# Patient Record
Sex: Female | Born: 1970 | Race: White | Hispanic: No | Marital: Married | Smoking: Current every day smoker
Health system: Southern US, Community
[De-identification: ages and names within clinical notes are randomized; demographics above are authoritative.]

## PROBLEM LIST (undated history)

## (undated) DIAGNOSIS — K5900 Constipation, unspecified: Secondary | ICD-10-CM

## (undated) DIAGNOSIS — F32A Depression, unspecified: Secondary | ICD-10-CM

## (undated) DIAGNOSIS — R519 Headache, unspecified: Secondary | ICD-10-CM

## (undated) DIAGNOSIS — F419 Anxiety disorder, unspecified: Secondary | ICD-10-CM

## (undated) DIAGNOSIS — J45909 Unspecified asthma, uncomplicated: Secondary | ICD-10-CM

## (undated) DIAGNOSIS — G4733 Obstructive sleep apnea (adult) (pediatric): Secondary | ICD-10-CM

## (undated) DIAGNOSIS — D649 Anemia, unspecified: Secondary | ICD-10-CM

## (undated) DIAGNOSIS — E119 Type 2 diabetes mellitus without complications: Secondary | ICD-10-CM

## (undated) DIAGNOSIS — M797 Fibromyalgia: Secondary | ICD-10-CM

## (undated) DIAGNOSIS — D259 Leiomyoma of uterus, unspecified: Secondary | ICD-10-CM

## (undated) HISTORY — PX: CHOLECYSTECTOMY: SHX55

## (undated) HISTORY — PX: LAPAROSCOPY: SHX197

---

## 1998-12-05 ENCOUNTER — Ambulatory Visit (HOSPITAL_COMMUNITY): Admission: RE | Admit: 1998-12-05 | Discharge: 1998-12-05 | Payer: Self-pay | Admitting: Gynecology

## 1998-12-22 ENCOUNTER — Inpatient Hospital Stay (HOSPITAL_COMMUNITY): Admission: EM | Admit: 1998-12-22 | Discharge: 1998-12-23 | Payer: Self-pay | Admitting: Emergency Medicine

## 1998-12-22 ENCOUNTER — Encounter: Payer: Self-pay | Admitting: Emergency Medicine

## 1999-04-08 ENCOUNTER — Encounter: Admission: RE | Admit: 1999-04-08 | Discharge: 1999-07-07 | Payer: Self-pay | Admitting: Obstetrics and Gynecology

## 1999-09-05 ENCOUNTER — Encounter: Payer: Self-pay | Admitting: Obstetrics and Gynecology

## 1999-09-05 ENCOUNTER — Ambulatory Visit (HOSPITAL_COMMUNITY): Admission: RE | Admit: 1999-09-05 | Discharge: 1999-09-05 | Payer: Self-pay | Admitting: Obstetrics and Gynecology

## 1999-09-05 ENCOUNTER — Inpatient Hospital Stay (HOSPITAL_COMMUNITY): Admission: AD | Admit: 1999-09-05 | Discharge: 1999-09-11 | Payer: Self-pay | Admitting: Obstetrics and Gynecology

## 2001-07-29 ENCOUNTER — Other Ambulatory Visit: Admission: RE | Admit: 2001-07-29 | Discharge: 2001-07-29 | Payer: Self-pay | Admitting: Obstetrics and Gynecology

## 2001-09-24 ENCOUNTER — Encounter: Admission: RE | Admit: 2001-09-24 | Discharge: 2001-12-23 | Payer: Self-pay | Admitting: Obstetrics and Gynecology

## 2002-02-04 ENCOUNTER — Encounter (INDEPENDENT_AMBULATORY_CARE_PROVIDER_SITE_OTHER): Payer: Self-pay | Admitting: Specialist

## 2002-02-04 ENCOUNTER — Inpatient Hospital Stay (HOSPITAL_COMMUNITY): Admission: AD | Admit: 2002-02-04 | Discharge: 2002-02-07 | Payer: Self-pay | Admitting: Obstetrics and Gynecology

## 2002-03-17 ENCOUNTER — Other Ambulatory Visit: Admission: RE | Admit: 2002-03-17 | Discharge: 2002-03-17 | Payer: Self-pay | Admitting: Obstetrics and Gynecology

## 2002-05-18 ENCOUNTER — Emergency Department (HOSPITAL_COMMUNITY): Admission: EM | Admit: 2002-05-18 | Discharge: 2002-05-18 | Payer: Self-pay | Admitting: Emergency Medicine

## 2003-03-30 ENCOUNTER — Other Ambulatory Visit: Admission: RE | Admit: 2003-03-30 | Discharge: 2003-03-30 | Payer: Self-pay | Admitting: Obstetrics and Gynecology

## 2003-12-22 ENCOUNTER — Encounter: Admission: RE | Admit: 2003-12-22 | Discharge: 2004-03-21 | Payer: Self-pay | Admitting: Family Medicine

## 2003-12-26 HISTORY — PX: GASTRIC BYPASS: SHX52

## 2004-02-12 ENCOUNTER — Encounter: Admission: RE | Admit: 2004-02-12 | Discharge: 2004-02-12 | Payer: Self-pay | Admitting: General Surgery

## 2004-02-13 ENCOUNTER — Ambulatory Visit (HOSPITAL_BASED_OUTPATIENT_CLINIC_OR_DEPARTMENT_OTHER): Admission: RE | Admit: 2004-02-13 | Discharge: 2004-02-13 | Payer: Self-pay | Admitting: General Surgery

## 2004-02-16 ENCOUNTER — Encounter: Admission: RE | Admit: 2004-02-16 | Discharge: 2004-03-01 | Payer: Self-pay | Admitting: General Surgery

## 2004-02-24 ENCOUNTER — Encounter: Admission: RE | Admit: 2004-02-24 | Discharge: 2004-02-24 | Payer: Self-pay | Admitting: General Surgery

## 2004-02-24 ENCOUNTER — Encounter: Payer: Self-pay | Admitting: General Surgery

## 2004-03-09 ENCOUNTER — Encounter: Payer: Self-pay | Admitting: Cardiology

## 2004-03-09 ENCOUNTER — Ambulatory Visit (HOSPITAL_COMMUNITY): Admission: RE | Admit: 2004-03-09 | Discharge: 2004-03-09 | Payer: Self-pay | Admitting: General Surgery

## 2004-03-22 ENCOUNTER — Encounter: Admission: RE | Admit: 2004-03-22 | Discharge: 2004-06-20 | Payer: Self-pay | Admitting: Family Medicine

## 2004-06-06 ENCOUNTER — Inpatient Hospital Stay (HOSPITAL_COMMUNITY): Admission: RE | Admit: 2004-06-06 | Discharge: 2004-06-09 | Payer: Self-pay | Admitting: General Surgery

## 2004-06-06 ENCOUNTER — Encounter (INDEPENDENT_AMBULATORY_CARE_PROVIDER_SITE_OTHER): Payer: Self-pay | Admitting: Specialist

## 2004-07-19 ENCOUNTER — Encounter: Admission: RE | Admit: 2004-07-19 | Discharge: 2004-10-17 | Payer: Self-pay | Admitting: Family Medicine

## 2004-11-29 ENCOUNTER — Encounter: Admission: RE | Admit: 2004-11-29 | Discharge: 2005-02-27 | Payer: Self-pay | Admitting: Family Medicine

## 2005-01-11 ENCOUNTER — Ambulatory Visit: Payer: Self-pay | Admitting: Pulmonary Disease

## 2005-02-09 ENCOUNTER — Ambulatory Visit (HOSPITAL_COMMUNITY): Admission: RE | Admit: 2005-02-09 | Discharge: 2005-02-09 | Payer: Self-pay | Admitting: Family Medicine

## 2005-05-26 ENCOUNTER — Other Ambulatory Visit: Admission: RE | Admit: 2005-05-26 | Discharge: 2005-05-26 | Payer: Self-pay | Admitting: Obstetrics and Gynecology

## 2008-02-28 ENCOUNTER — Encounter (INDEPENDENT_AMBULATORY_CARE_PROVIDER_SITE_OTHER): Payer: Self-pay | Admitting: Obstetrics and Gynecology

## 2008-02-28 ENCOUNTER — Ambulatory Visit (HOSPITAL_COMMUNITY): Admission: RE | Admit: 2008-02-28 | Discharge: 2008-02-28 | Payer: Self-pay | Admitting: Obstetrics and Gynecology

## 2011-05-09 NOTE — Op Note (Signed)
Mackenzie Castillo, Mackenzie Castillo             ACCOUNT NO.:  [id number]   MEDICAL RECORD NO.:  [id number]          PATIENT TYPE:  AMB   LOCATION:  SDC                           FACILITY:  [facility]   PHYSICIAN:  Guy Sandifer. Henderson Cloud, M.D. DATE OF BIRTH:  [birth year]   DATE OF PROCEDURE:  02/28/2008  DATE OF DISCHARGE:                               OPERATIVE REPORT   PREOPERATIVE DIAGNOSIS:  Right lower quadrant pain.   POSTOPERATIVE DIAGNOSIS:  Right ovarian cyst.   PROCEDURE:  Opened laparoscopy with right salpingo-oophorectomy.   SURGEON:  Guy Sandifer. Henderson Cloud, M.D.   ANESTHESIA:  General with endotracheal intubation.   SPECIMENS:  Right ovary and tube to pathology.   ESTIMATED BLOOD LOSS:  Minimal.   INDICATIONS AND CONSENT:  This patient is a 40 year old married white  female G2, P2, status post tubal ligation with right lower quadrant  pain.  Details are dictated in the history and physical.  Laparoscopy,  probable right salpingo-oophorectomy is discussed with the patient  preoperatively.  Potential risks and complications have been discussed  preoperatively including, but limited to infection, organ damage,  bleeding requiring transfusion of blood products with possible HIV and  hepatitis acquisition, DVT, PE, pneumonia, recurrent pain, laparotomy.  All questions were answered and consent is signed on the chart.   FINDINGS:  Upper abdomen is grossly normal.  Uterus has a veil of  adipose tissue along the anterior fundus.  The left ovary is normal.  The right ovary contains approximately 4 cm smooth cyst.  There are no  adhesions.   PROCEDURE:  The patient is taken to the operating room where she is  identified, placed in dorsal supine position and general anesthesia is  induced via endotracheal intubation.  She is then prepped abdominally  and vaginally.  Bladder is straight catheterized.  Hulka tenaculum is  placed in the uterus as manipulator and she is draped in a sterile  fashion.  The  infraumbilical and suprapubic areas are injected in the  midline with 0.50% plain Marcaine.  An infraumbilical incision is made.  Using towel clips on either side of the umbilicus to elevate the  anterior abdominal wall, an attempt is made at placing a disposable  Veress needle.  However, this results retroperitoneal placement of the  needle tip.  Therefore, it is converted to open laparoscopy.  The  infraumbilical incision is extended slightly.  Under direct  visualization, dissection is carried out in layers to the fascia.  The  fascia is grasped and incised.  Angles are anchored with zero Monocryl  sutures at each angle.  Blunt dissection is used to enter the peritoneal  cavity without difficulty.  A disposable open laparoscopic trocar sleeve  is then placed and anchored down.  Pneumoperitoneum is induced.  A small  incision is made in the midline of the abdomen at a point that is  suprapubic.  A 5 mm disposable XL bladeless trocar sleeve is then placed  under direct visualization.  The above findings are noted.  The course  the right ureter seen to be clear surgery.  Then using the gyrus bipolar  cautery cutting instrument, the right infundibulopelvic ligament is  taken down, working across the mesosalpinx to the utero-ovarian which is  also taken down.  Good hemostasis is maintained.  The tube and ovary are  placed in the anterior cul-de-sac.  Then using the 5 mm laparoscopic  through the suprapubic trocar sleeve, the EndoCatch is placed through  the umbilical trocar sleeve.  The ovary is then easily retrieved through  the umbilical incision.  Switching back to the operative laparoscopic,  inspection reveals excellent hemostasis all around.  Suprapubic trocar  sleeve is removed and hemostasis is again noted.  The umbilical trocar  sleeve is removed, thereby reducing the pneumoperitoneum.  The angle  sutures are tied in the midline.  Then under good visualization, the  same suture is  used to close the anterior fascia.  The skin incisions  are closed with interrupted 3-0 Vicryl sutures.  Dermabond is applied.  Hulka tenaculum is removed and good hemostasis is noted.  All counts  correct.  The patient is awakened, taken to recovery room in stable  condition.      Guy Sandifer Henderson Cloud, M.D.  Electronically Signed     JET/MEDQ  D:  02/28/2008  T:  03/01/2008  Job:  [id number]

## 2011-05-09 NOTE — H&P (Signed)
Mackenzie Castillo, HOGLUND             ACCOUNT NO.:  [id number]   MEDICAL RECORD NO.:  [id number]          PATIENT TYPE:  AMB   LOCATION:  SDC                           FACILITY:  [facility]   PHYSICIAN:  Guy Sandifer. Henderson Cloud, M.D. DATE OF BIRTH:  Jul 12, 1971   DATE OF ADMISSION:  02/28/2008  DATE OF DISCHARGE:                              HISTORY & PHYSICAL   CHIEF COMPLAINT:  Right lower quadrant pain.   HISTORY OF PRESENT ILLNESS:  This patient is a 40 year old married white  female, G2, P2, status post tubal ligation, who has right lower quadrant  and central pelvic pain most days.  This has bothered her on and off for  at least 2 or 3 years but is getting worse with each menses.  It  occasionally takes her off her feet.  The patient reports a negative  evaluation for a hernia, as well as a normal colonoscopy.  Ultrasound in  my office on January 30, 2008 reveals a uterus measuring 10.9 x 4.9 x  5.6 cm.  The right ovary has a 3.4 cm septated hemorrhagic cyst,  possibly an endometrioma.  The left ovary appeared normal.  There was no  free fluid.  She is being admitted for laparoscopy with possible right  salpingo-oophorectomy.  Potential risks and complications have been  discussed preoperatively.   PAST MEDICAL HISTORY:  Negative.   PAST SURGICAL HISTORY:  1. Laparoscopic tubal ligation.  2. Laparoscopic gastric bypass.  3. Cholecystectomy.  4. Laparoscopy in 1998.   OBSTETRICAL HISTORY:  Cesarean section x2.   MEDICATIONS:  Multivitamins.   ALLERGIES:  CODEINE leading to itching.   SOCIAL HISTORY:  Denies tobacco, alcohol or drug abuse.   FAMILY HISTORY:  Positive for diabetes, heart disease, thyroid disease  and headaches.   REVIEW OF SYSTEMS:  NEUROLOGIC:  Denies headache.  CARDIAC:  Denies  chest pain.  PULMONARY:  Denies shortness of breath.  GI:  Denies recent  changes in bowel habits.   PHYSICAL EXAMINATION:  VITAL SIGNS:  Height 5 feet 4 inches.  Weight 284  pounds, blood  pressure 122/78.  HEENT:  Without thyromegaly.  LUNGS:  Clear to auscultation.  HEART:  Regular rate and rhythm.  BACK:  Without CVA tenderness.  BREASTS:  Without mass, retraction or discharge.  ABDOMEN:  Obese, soft, with mild bilateral lower quadrant tenderness  with no rebound or masses.  PELVIC:  Vulva, vagina and cervix without lesion.  Exam highly  compromised by patient habitus.  EXTREMITIES:  Grossly within normal limits.  NEUROLOGIC:  Grossly within normal limits.   ASSESSMENT:  Right lower quadrant pain.   PLAN:  Laparoscopy, possible right salpingo-oophorectomy.      Guy Sandifer Henderson Cloud, M.D.  Electronically Signed     JET/MEDQ  D:  02/18/2008  T:  02/19/2008  Job:  [id number]

## 2011-05-12 NOTE — Op Note (Signed)
[facility]  Patient:    Mackenzie Castillo, Mackenzie Castillo Visit Number: [id number] MRN: [id number]          Service Type: OBS Location: 910A 9115 [id number] Attending Physician:  Soledad Gerlach Dictated by:   Guy Sandifer Arleta Creek, M.D. Proc. Date: 02/04/02 Admit Date:  02/04/2002                             Operative Report  PREOPERATIVE DIAGNOSES:       1. Intrauterine pregnancy at 57 weeks estimated                                  gestational age.                               2. Gestational diabetes on insulin.                               3. Asymmetric intrauterine growth retardation.  POSTOPERATIVE DIAGNOSES:      1. Intrauterine pregnancy at 47 weeks estimated                                  gestational age.                               2. Gestational diabetes on insulin.                               3. Asymmetric intrauterine growth retardation.  PROCEDURE:                    Repeat low transverse cesarean section with bilateral tubal ligation.  SURGEON:                      Guy Sandifer. Arleta Creek, M.D.  ANESTHESIA:                   Spinal by Cline Crock, M.D.  ESTIMATED BLOOD LOSS:         800 cc.  FINDINGS:                     Viable female infant.  Apgars of 8 and 9 at one and five minutes respectively.  Birth weight is 4 pounds 13 ounces.  Arterial cord pH is pending.  INDICATIONS AND CONSENT:      This patient is a 40 year old married white female G2, P1 with an EDC of March 03, 2002.  Prenatal care has been complicated by gestational diabetes on insulin.  The patient presented for amniocentesis for fetal lung maturity yesterday.  On ultrasound the abdominal circumference had dropped off dramatically.  The estimated fetal weight had also dropped dramatically as well.  The ratios were asymmetric.  It was felt this was consistent with evolving asymmetric IUGR.  In view of this delivery was recommended.  Amniocentesis was not done.  The  patient also requests tubal ligation.  Potential risks of the surgery have been discussed including, but not limited to, infection, bleeding requiring transfusion of  blood products with possible HIV and hepatitis acquisition, DVT, PE, pneumonia.  Permanence of tubal ligation as well as failure risks and increased ectopic risks have been discussed.  All questions are answered.  PROCEDURE:                    Patient is taken to the operating room where a spinal anesthetic is placed.  She is then placed in the dorsal supine position with a 15 degree left lateral wedge.  She is prepped abdominally.  Foley catheter is placed and the bladder is drained.  She is draped in a sterile fashion.  After testing for adequate spinal anesthesia skin is entered through the previous Pfannenstiel scar.  Dissection is carried out in layers to the peritoneum.  The peritoneum is incised and extended superiorly and inferiorly. Vesicouterine peritoneum is taken down cephalolaterally.  The bladder flap is developed and the bladder blade is placed.  Uterus is then incised in a low transverse manner.  The uterine cavity is entered bluntly with a hemostat. The uterine incision is then extended cephalolaterally with the fingers. Artificial rupture of membranes is then carried out for clear fluid.  The vertex is then delivered and the oro and nasopharynx are suctioned.  Nuchal cord x3 is noted.  Remainder of the infant is delivered.  Good cry and tone is noted.  The cord is clamped and cut and the infant is handed to the waiting pediatrics team.  Placenta is manually delivered and sent to pathology. Intrauterine contour is normal.  The uterus is closed in a running locking layer of 0 Monocryl suture which achieves good hemostasis.  The left ovary is noted to have an adhesion to the omentum.  This is taken down with the Bovie without difficulty.  The ovary otherwise looks normal.  The left fallopian tube is identified  from cornu from fimbria, grasped in its mid ampullary portion with the Babcock clamp.  An intervening knuckle of tube is then doubly ligated with two free ties of 0 plain suture.  The intervening knuckle of tissue is then sharply resected.  Monopolar cautery is used to obtain complete hemostasis.  Similar procedure is carried out on the right side.  The right ovary appears normal.  Anterior peritoneum is then closed in a running fashion with 0 Monocryl suture which is also used to reapproximate the pyramidalis muscle in the midline.  Anterior rectus fascia is closed in a running fashion with 0 PDS suture and the skin is closed with clips.  All sponge, instrument, and needle counts are correct and the patient is transferred to the recovery room in stable condition. Dictated by:   Guy Sandifer Arleta Creek, M.D. Attending Physician:  Soledad Gerlach DD:  02/04/02 TD:  02/04/02 Job: [id number]

## 2011-05-12 NOTE — Op Note (Signed)
Mackenzie Castillo, Mackenzie Castillo                       ACCOUNT NO.:  [id number]   MEDICAL RECORD NO.:  [id number]                   PATIENT TYPE:  INP   LOCATION:  0001                                 [facility]ACILITY:  [facility]   PHYSICIAN:  Sharlet Salina T. Hoxworth, M.D.          DATE OF BIRTH:  1971-02-20   DATE OF PROCEDURE:  06/06/2004  DATE OF DISCHARGE:                                 OPERATIVE REPORT   DISCHARGE DIAGNOSES:  1. Morbid obesity.  2. Cholelithiasis.   SURGICAL PROCEDURES:  1. Laparoscopic Roux-Y gastric bypass.  2. Laparoscopic cholecystectomy with intraoperative cholangiogram.   SURGEON:  Dr. Johna Sheriff   ASSISTANT:  Dr. Danna Hefty   ANESTHESIA:  General.   BRIEF HISTORY:  Mackenzie Castillo is a 40 year old white female with a  lifelong history of progressive morbid obesity unresponsive to multiple  medical attempts at weight loss.  She has multiple comorbidities including  diabetes mellitus, asthma, sleep apnea, and urinary incontinence.  After  extensive discussion of options and benefits and risks of surgery detailed  elsewhere, we have elected to proceed with laparoscopic Roux-Y gastric  bypass.  Preoperative work-up has also revealed cholelithiasis, and we have  recommended proceeding with cholecystectomy with cholangiogram at the same  time.  Specific risks of bile leak, bile duct injury were discussed and  understood.  She is now brought to the operating room for these procedures.   DESCRIPTION OF OPERATION:  The patient was brought to the operating room,  placed in supine position on the operating table, and general endotracheal  anesthesia was induced.  She had received preoperative antibiotics.  Lovenox  40 mg had been given preoperatively.  PAS were in place.  The abdomen was  widely sterilely prepped and draped.  Trocar sites were anesthetized with  local anesthesia before incisions.  A 1 cm incision was made in the left  costal margin, and abdominal access  was obtained with an OptiView trocar  without difficulty and pneumoperitoneum established.  Under direct vision,  11 mm trocars were placed in the right mid abdomen, right upper quadrant  through the falciform ligament and left mid abdomen and a 5 mm trocar in the  left flank.  There were some omental adhesions in the pelvis from previous C-  section, and these were mobilized with harmonic scalpel, completely freeing  the omentum.  The omentum and transverse colon was elevated and ligament of  Treitz identified.  A 40 cm afferent limb was measured, and small bowel at  this point appeared quite mobile up toward the edge of the liver.  The small  bowel was then divided with a single firing of the EndoGIA vascular load  stapler.  The mesentery was further divided with the harmonic scalpel down  to the first arcade which appeared to provide plenty of mobility.  Both ends  of the bowel appeared pink and healthy with intact staple lines.  The end of  the efferent limb  was marked by suturing a Penrose drain to it.  A 100 cm  efferent limb was then measured.  At this point, a jejunojejunostomy was  created with enterotomies with the harmonic scalpel and a single firing of  the EndoGIA vascular load stapler.  The staple line appeared intact without  bleeding.  The common enterotomy was closed with running 2-0 Vicryl, begun  at either end of the enterotomy and tied centrally.  The mesenteric defect  behind the afferent limb was then exposed then closed with running 2-0 silk,  extending this out onto the jejunojejunostomy.  Tisseal tissue sealant was  then used over the staple and suture lines.  Following this, the patient was  placed in steep reverse Trendelenburg position, and a Nathanson retractor  was placed through a 5 mm incision in the epigastrium and the left lobe of  the liver elevated, nicely exposing the hiatus and stomach.  Angle of His  was then mobilized dividing peritoneum over the left  crus, and dissection  was carried down along the left crus toward the lesser sac bluntly.  A point  along the lesser curve was then chosen for creation of the pouch about 5 cm  from the EG junction just beyond the second vessel.  Peritoneum along the  lesser curve was incised with the harmonic scalpel, and dissection was  carried at a right angle to the stomach toward the lesser sac.  The  dissection progressed nicely, although we really were not able to ever enter  a free lesser sac.  However, the posterior stomach was nicely mobilized with  basically areolar-type of attachments and initial firing of the EndoGIA blue-  load stapler was performed at the right angles to the lesser curve.  Four  further firings were then used to create a tubular pouch up toward the angle  of His, at which point the stomach was seen to be completely divided.  Hemostasis was assured.  The efferent was brought up to the pouch and  reached without tension in antecolic fashion.  An initial posterior row of  running 2-0 Vicryl was placed between the efferent limb and the gastric  pouch.  Enterotomies were then made with the harmonic scalpel, and an  anastomosis was made with the 45 mm linear stapler blue load.  The staple  line was inspected and was intact without bleeding.  The common enterotomy  was then closed with running 2-0 Vicryl, begun at either end and tied  centrally.  Following this, the Ewald tube was passed through the  anastomosis and an outer anterior row of running 2-0 Vicryl was placed.  At  this point, the patient was endoscoped from above and with the pouch tightly  distended, the efferent limb clamped, there was no evidence of leak of the  pouch under saline irrigation.  The saline was then evacuated.  A closed  suction drain was brought out through the lateral trocar site and left  subhepatic space.  Tisseal tissue sealant was used to cover the staple and suture lines.  At this point, attention  was turned to the gallbladder.  The  Nathanson retractor was removed.  The right epigastric port was redirected  toward the right upper quadrant.  Two additional 5 mm trocars were placed in  the right flank under direct vision.  The gallbladder was visualized and the  fundus elevated.  The liver was quite large and heavy, and exposure was  somewhat difficult, although visualization was adequate of the  distal  gallbladder and Calot's triangle.  Fibrofatty tissue was stripped off the  neck of the gallbladder toward the porta hepatis.  The distal gallbladder  was dissected thoroughly and with somewhat tedious dissection, the cystic  artery and cystic duct were clearly identified, the cystic duct dissected  over about 1 cm, the cystic duct gallbladder junction clearly defined.  At  this point, the cystic duct was clipped at the junction with the  gallbladder, and the cystic artery was clipped.  Operative cholangiogram was  then obtained through the cystic duct which showed normal common bile duct  and intrahepatic ducts with free flow into the duodenum and no filling  defects.  Following this, the Cholangiocath was removed, and the cystic duct  was doubly clipped proximally and divided.  The cystic artery was doubly  clipped proximally and divided.  The gallbladder was dissected free from its  bed using hook cautery and harmonic scalpel.  Again, dissection was quite  tedious, and exposure was difficult, but I progressed satisfactorily.  There  was one area of venous bleeding in the gallbladder bed near the infundibulum  and was a little difficult to control but was completely stopped with  cautery.  The Surgicel pack was then placed additionally, but it stayed dry  during the remainder of the dissection.  The gallbladder was then completely  dissected free from its attachments, placed in EndoCatch bag and brought out  through one of the 11 mm trocar sites evacuating the stones, and the   gallbladder came out without enlarging the site.  The operative site was  inspected for hemostasis which appeared complete.  Trocars were then all  removed.  Skin incisions were closed with staples.  Sponge, needle, and  instrument counts were correct.  Dry sterile dressings were applied and the  patient taken to recovery in good condition.                                               Lorne Skeens. Hoxworth, M.D.    Tory Emerald  D:  06/06/2004  T:  06/06/2004  Job:  [id number]

## 2011-05-12 NOTE — Discharge Summary (Signed)
[facility]  Patient:    Mackenzie Castillo, Mackenzie Castillo Visit Number: [id number] MRN: [id number]          Service Type: OBS Location: 910A 9115 01 Attending Physician:  Cordelia Pen Ii Dictated by:   Danie Chandler, R.N. Admit Date:  02/04/2002 Discharge Date: 02/07/2002                             Discharge Summary  ADMITTING DIAGNOSES:          1. Intrauterine pregnancy at 36-weeks estimated                                  gestational age.                               2. Gestational diabetes on insulin.                               3. Asymmetric intrauterine growth retardation.  DISCHARGE DIAGNOSES:          1. Intrauterine pregnancy at 36-weeks estimated                                  gestational age.                               2. Gestational diabetes on insulin.                               3. Asymmetric intrauterine growth retardation.  PROCEDURES:                   On February 04, 2002, repeat low transverse cesarean section with bilateral tubal ligation.  REASON FOR ADMISSION:         The patient is a 40 year old married white female, gravida 2, para 1, with estimated date of confinement of March 03, 2002.  Her prenatal care has been complicated by gestational diabetes on insulin.  The patient presented for amniocentesis for fetal lung maturity on February 03, 2002.  On ultrasound, the abdominal circumference had dropped off dramatically.  The estimated fetal weight had also dropped dramatically as well.  The ratios were asymmetric. It was felt this was consistent with evolving asymmetric IUG.  In view of this, delivery was recommended.  Amniocentesis was not done.  The patient also requested tubal ligation.  HOSPITAL COURSE:              The patient was taken to the operating room and underwent the above-named procedure without complications.  This was productive of a viable female infant with Apgars of 8 at one minute and 9  at five minutes.  Postoperatively, on day one, the patient had a good return of bowel function.  Her hemoglobin was 9.9, hematocrit 29.0, and white blood cell count 6.3.  Postoperative day two, she was tolerating regular diet and ambulating well without difficulty.  She also had good pain control.  Blood sugars were stable.  The patient was discharged home on postoperative day three.  CONDITION ON DISCHARGE:  Good.  DIET:                         Regular as tolerated.  ACTIVITY:                     No heavy lifting, no driving or vaginal entry.  FOLLOW-UP:                    She is to follow up in the office in one to two weeks for incision check and she is to call for temperature greater than 100 degrees, persistent nausea or vomiting, heavy vaginal bleeding, and/or redness or drainage from the incision site.  She is to follow up with Dr. Chestine Spore in reference to gestational diabetes mellitus.  DISCHARGE MEDICATIONS:        1. Prenatal vitamin 1 p.o. q.d.                               2. Tylox #30 as directed by doctor. Dictated by:   Danie Chandler, R.N. Attending Physician:  Soledad Gerlach DD:  02/07/02 TD:  02/07/02 Job: [id number]

## 2011-05-12 NOTE — Op Note (Signed)
Mackenzie Castillo, Mackenzie Castillo                       ACCOUNT NO.:  [id number]   MEDICAL RECORD NO.:  [id number]                   PATIENT TYPE:  INP   LOCATION:  0001                                 FACILITY:  [facility]   PHYSICIAN:  Vikki Ports, M.D.         DATE OF BIRTH:  05-Nov-1971   DATE OF PROCEDURE:  06/06/2004  DATE OF DISCHARGE:                                 OPERATIVE REPORT   PROCEDURE:  Upper endoscopy.   SURGEON:  Danna Hefty, M.D.   DESCRIPTION OF PROCEDURE:  At the completion of laparoscopic Roux-en-Y  gastric bypass by Dr. Jaclynn Guarneri, I inserted the Olympus endoscope through  the oral pharynx and down through the esophagus.  The pouch was easily  visualized as was the anastomosis.  It was patent.  There was no evidence of  leak.  The pouch measured 6 cm in length.  It was aspirated on pulling out  the scope.  The remainder of the dictation will be dictated by Dr. Johna Sheriff.                                               Vikki Ports, M.D.    KRH/MEDQ  D:  06/06/2004  T:  06/06/2004  Job:  [id number]

## 2011-05-12 NOTE — Discharge Summary (Signed)
Mackenzie Castillo, TUCCI                       ACCOUNT NO.:  [id number]   MEDICAL RECORD NO.:  [id number]                   PATIENT TYPE:  INP   LOCATION:  0483                                 FACILITY:  [facility]   PHYSICIAN:  Sharlet Salina T. Hoxworth, M.D.          DATE OF BIRTH:  03/31/1971   DATE OF ADMISSION:  06/06/2004  DATE OF DISCHARGE:  06/09/2004                                 DISCHARGE SUMMARY   DISCHARGE DIAGNOSES:  1. Morbid obesity.  2. Cholelithiasis.   OPERATION/PROCEDURE:  1. Laparoscopic Roux-Y gastric bypass June 13th.  2. Laparoscopic cholecystectomy June 13th.   HISTORY OF PRESENT ILLNESS:  Ms. Mackenzie Castillo is a 40 year old white female a  patient of Dr. Leonides Sake, with an essentially lifelong history of  progressive obesity unresponsive to medical management.  She has developed  significant comorbidities including diabetes, asthma, and chronic joint  pain.  After an extensive evaluation and preoperative workup, the patient is  admitted for a laparoscopic Roux-Y gastric bypass.  She is also found to  have gallstones and a cholecystectomy will be performed as well.   PAST MEDICAL HISTORY:  Surgeries significant for diagnostic laparoscopy in  1999, C-section x2 in 2000 and 2003, and a tubal ligation in 2003.  She is  followed for ADOM, asthma, and seasonal allergies.   MEDICATIONS:  1. Singulair 10 mg daily.  2. Allegra one daily.  3. Advair Diskus twice daily.  4. Albuterol 2 puffs p.r.n.  5. Glucophage   There are no known allergies.   Social History, Family History, and Review of Systems see dictated H&P.   PHYSICAL EXAMINATION:  VITAL SIGNS:  She is 5 feet 5 inches, 334 pounds with  a BMI of 55.7.  The remainder of the physical examination is unremarkable.   HOSPITAL COURSE:  The patient was admitted on the morning of her procedure  and underwent an unremarkable laparoscopic Roux-Y gastric bypass and  cholecystectomy.  Her postoperative course was  smooth.  She was afebrile on  the first postoperative day and Gastrografin swallow was negative.  She was  begun on liquids which we were able to advance to full liquids without  difficulty.  Follow up hemoglobin was 10.4 and white count normal.  She was  discharged home on June 09, 2004.   DISCHARGE MEDICATIONS:  The same as admission plus Roxicet elixir for pain.   FOLLOW UP:  In my office in 1 week.                                               Lorne Skeens. Hoxworth, M.D.    Tory Emerald  D:  06/29/2004  T:  06/29/2004  Job:  [id number]   cc:   Holley Bouche, M.D.  [address]  [place], [place] [address]  Fax: [phone]

## 2011-09-15 LAB — CBC
Hemoglobin: 10.8 — ABNORMAL LOW
MCV: 63.7 — ABNORMAL LOW
Platelets: 328
RDW: 22.9 — ABNORMAL HIGH

## 2012-03-18 ENCOUNTER — Other Ambulatory Visit: Payer: Self-pay | Admitting: Family Medicine

## 2012-03-18 DIAGNOSIS — M545 Low back pain: Secondary | ICD-10-CM

## 2012-03-19 ENCOUNTER — Ambulatory Visit
Admission: RE | Admit: 2012-03-19 | Discharge: 2012-03-19 | Disposition: A | Discharge status: Home / Self Care | Payer: No Typology Code available for payment source | Source: Ambulatory Visit | Attending: Family Medicine | Admitting: Family Medicine

## 2012-03-19 DIAGNOSIS — M545 Low back pain: Secondary | ICD-10-CM

## 2013-11-03 ENCOUNTER — Other Ambulatory Visit: Payer: Self-pay | Admitting: Obstetrics and Gynecology

## 2013-11-03 DIAGNOSIS — R928 Other abnormal and inconclusive findings on diagnostic imaging of breast: Secondary | ICD-10-CM

## 2013-11-25 ENCOUNTER — Ambulatory Visit
Admission: RE | Admit: 2013-11-25 | Discharge: 2013-11-25 | Disposition: A | Discharge status: Home / Self Care | Payer: BC Managed Care – PPO | Source: Ambulatory Visit | Attending: Obstetrics and Gynecology | Admitting: Obstetrics and Gynecology

## 2013-11-25 DIAGNOSIS — R928 Other abnormal and inconclusive findings on diagnostic imaging of breast: Secondary | ICD-10-CM

## 2014-11-24 ENCOUNTER — Other Ambulatory Visit: Payer: Self-pay | Admitting: Obstetrics and Gynecology

## 2014-11-25 LAB — CYTOLOGY - PAP

## 2015-11-08 ENCOUNTER — Emergency Department (HOSPITAL_COMMUNITY): Payer: Self-pay

## 2015-11-08 ENCOUNTER — Encounter (HOSPITAL_COMMUNITY): Payer: Self-pay

## 2015-11-08 ENCOUNTER — Emergency Department (HOSPITAL_COMMUNITY)
Admission: EM | Admit: 2015-11-08 | Discharge: 2015-11-08 | Disposition: A | Discharge status: Home / Self Care | Payer: Self-pay | Attending: Emergency Medicine | Admitting: Emergency Medicine

## 2015-11-08 DIAGNOSIS — F17210 Nicotine dependence, cigarettes, uncomplicated: Secondary | ICD-10-CM | POA: Insufficient documentation

## 2015-11-08 DIAGNOSIS — M5442 Lumbago with sciatica, left side: Secondary | ICD-10-CM

## 2015-11-08 DIAGNOSIS — E119 Type 2 diabetes mellitus without complications: Secondary | ICD-10-CM | POA: Insufficient documentation

## 2015-11-08 DIAGNOSIS — M5440 Lumbago with sciatica, unspecified side: Secondary | ICD-10-CM | POA: Insufficient documentation

## 2015-11-08 DIAGNOSIS — Z79899 Other long term (current) drug therapy: Secondary | ICD-10-CM | POA: Insufficient documentation

## 2015-11-08 HISTORY — DX: Type 2 diabetes mellitus without complications: E11.9

## 2015-11-08 MED ORDER — OXYCODONE-ACETAMINOPHEN 5-325 MG PO TABS: 1.0000 | ORAL_TABLET | Freq: Four times a day (QID) | ORAL | Status: DC | PRN

## 2015-11-08 MED ORDER — CYCLOBENZAPRINE HCL 10 MG PO TABS
5.0000 mg | ORAL_TABLET | Freq: Once | ORAL | Status: AC
  Administered 2015-11-08: 5 mg via ORAL
  Filled 2015-11-08: qty 1

## 2015-11-08 MED ORDER — OXYCODONE-ACETAMINOPHEN 5-325 MG PO TABS
1.0000 | ORAL_TABLET | Freq: Once | ORAL | Status: AC
  Administered 2015-11-08: 1 via ORAL
  Filled 2015-11-08: qty 1

## 2015-11-08 MED ORDER — PREDNISONE 10 MG (21) PO TBPK: 10.0000 mg | ORAL_TABLET | Freq: Every day | ORAL | Status: DC

## 2015-11-08 MED ORDER — DEXAMETHASONE SODIUM PHOSPHATE 4 MG/ML IJ SOLN
10.0000 mg | Freq: Once | INTRAMUSCULAR | Status: AC
  Administered 2015-11-08: 10 mg via INTRAMUSCULAR
  Filled 2015-11-08: qty 3

## 2015-11-08 NOTE — Discharge Instructions (Signed)
Ms. Frevert,  Nice meeting you! Your xray showed degenerative changes in your spine. Please read the following information regarding back pain. Please follow-up with your primary care provider within one week. After your pain medication has run out, you may take 800 mg ibuprofen (four 200 mg tablets) three times daily with meals for 2 weeks. Return to the emergency department if you develop fevers, chills, increased pain/numbness, inability to walk, loss of bowel or bladder control. I hope you feel better soon!  S. Wendie Simmer, PA-C

## 2015-11-08 NOTE — ED Provider Notes (Signed)
CSN: [id number]     Arrival date & time 11/08/15  [id number] History   First MD Initiated Contact with Patient 11/08/15 1010     Chief Complaint  Patient presents with  . Back Pain   HPI   Mackenzie Castillo is a 44 y.o. F PMH significant for DMII presenting with a one month history of back pain that has become progressively worse over the last week. She presented to her PCP for similar back pain, and was prescribed tramadol, which provided moderate relief. She describes her pain as lumbar in location, radiating down her left leg, sharp, constant, worse with movement. 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 She denies fever, chills, CP, SOB, abdominal pain, change in bowel/bladder habits or function, inability to ambulate.   Past Medical History  Diagnosis Date  . Diabetes mellitus without complication [facility])    Past Surgical History  Procedure Laterality Date  . Gastric bypass  2005   History reviewed. No pertinent family history. Social History  Substance Use Topics  . Smoking status: Heavy Tobacco Smoker -- 1.00 packs/day    Types: Cigarettes  . Smokeless tobacco: None  . Alcohol Use: Yes     Comment: socially   OB History    No data available     Review of Systems  Ten systems are reviewed and are negative for acute change except as noted in the HPI   Allergies  Review of patient's allergies indicates no known allergies.  Home Medications   Prior to Admission medications   Medication Sig Start Date End Date Taking? Authorizing Provider  Capsaicin 0.025 % PADS Apply 1 patch topically daily as needed (back pain).   Yes Historical Provider, MD  tizanidine (ZANAFLEX) 2 MG capsule Take 2-4 mg by mouth at bedtime.   Yes Historical Provider, MD  traMADol (ULTRAM) 50 MG tablet Take 50-100 mg by mouth every 6 (six) hours as needed for moderate pain.   Yes Historical Provider, MD   oxyCODONE-acetaminophen (PERCOCET/ROXICET) 5-325 MG tablet Take 1-2 tablets by mouth every 6 (six) hours as needed for severe pain. 11/08/15   [place] Lions, PA-C  predniSONE (STERAPRED UNI-PAK 21 TAB) 10 MG (21) TBPK tablet Take 1 tablet (10 mg total) by mouth daily. Take 6 tabs by mouth daily  for 2 days, then 5 tabs for 2 days, then 4 tabs for 2 days, then 3 tabs for 2 days, 2 tabs for 2 days, then 1 tab by mouth daily for 2 days 11/08/15   [place] Lions, PA-C   BP 120/80 mmHg  Pulse 80  Temp(Src) 99.1 F (37.3 C) (Oral)  Resp 18  Ht 5\' 4"  (1.626 m)  Wt 265 lb (120.203 kg)  BMI 45.46 kg/m2  SpO2 99%  LMP 09/08/2015 Physical Exam  Constitutional: She is oriented to person, place, and time. She appears well-developed and well-nourished. No distress.  HENT:  Head: Normocephalic and atraumatic.  Mouth/Throat: Oropharynx is clear and moist. No oropharyngeal exudate.  Eyes: Conjunctivae and EOM are normal. Pupils are equal, round, and reactive to light. Right eye exhibits no discharge. Left eye exhibits no discharge. No scleral icterus.  Neck: Normal range of motion. Neck supple.  Cardiovascular: Normal rate, regular rhythm, normal heart sounds and intact distal pulses.  Exam reveals no gallop and no friction rub.   No murmur heard. Pulmonary/Chest: Effort normal and breath sounds normal. No respiratory distress. She  has no wheezes. She has no rales. She exhibits no tenderness.  Abdominal: Soft. Bowel sounds are normal. She exhibits no distension and no mass. There is no tenderness. There is no rebound and no guarding.  Musculoskeletal: Normal range of motion. She exhibits tenderness. She exhibits no edema.  Midline and paraspinal tenderness at L3-L5. Straight leg raise positive.   Lymphadenopathy:    She has no cervical adenopathy.  Neurological: She is alert and oriented to person, place, and time. She displays normal reflexes. No cranial nerve deficit. She exhibits  normal muscle tone. Coordination normal.  Skin: Skin is warm and dry. No rash noted. She is not diaphoretic. No erythema.  Psychiatric: She has a normal mood and affect. Her behavior is normal.  Nursing note and vitals reviewed.   ED Course  Procedures   Imaging Review Dg Lumbar Spine Complete  11/08/2015  CLINICAL DATA:  Chronic low back pain for several weeks, no known injury, initial encounter EXAM: LUMBAR SPINE - COMPLETE 4+ VIEW COMPARISON:  None. FINDINGS: Five lumbar type vertebral bodies are well visualized. Vertebral body height is well maintained. No pars defects or spondylolisthesis is seen. Mild osteophytic changes are noted. IMPRESSION: Degenerative change without acute abnormality. Electronically Signed   By: Inez Catalina M.D.   On: 11/08/2015 12:08   I have personally reviewed and evaluated these images and lab results as part of my medical decision-making.  MDM   Final diagnoses:  Left-sided low back pain with sciatica, sciatica laterality unspecified   Patient non-toxic appearing. VSS. Extraspinal disorders (ruptured AAA, dissection, meningitis, MI, GI disorders, pelvic disorders, pyelonephritis, pleuritis, pneumonia) less likely based on patient interview, past medical history and exam. Although patient's pain has not persisted for 6 weeks or more, will obtain lumbar films since pain has increased over the last week.   Lumbar xray showed degenerative change but negative for acute process. Discussed results with patient. Upon reevaluation, patient is feeling better after percocet. Will give percocet for home for a few days, and encouraged 800 mg ibuprofen three times daily with meals. Patient may be safely discharged home. Discussed reasons for return. Patient to follow-up with primary care provider within one week. Patient in understanding and agreement with the plan.   [place] Lions, PA-C 11/10/15 [place], MD 11/13/15 [phone]

## 2015-11-08 NOTE — ED Notes (Signed)
Pt reports left lower back pain that has been present x1 month.  Pt reports the pain had eased off for 1 week but returned for the past two.  Pt denies any urinary symptoms or injury.  Pain worsens with movement.

## 2016-09-30 ENCOUNTER — Emergency Department (HOSPITAL_COMMUNITY)
Admission: EM | Admit: 2016-09-30 | Discharge: 2016-09-30 | Disposition: A | Discharge status: Home / Self Care | Payer: Self-pay | Attending: Emergency Medicine | Admitting: Emergency Medicine

## 2016-09-30 ENCOUNTER — Emergency Department (HOSPITAL_COMMUNITY): Payer: Self-pay

## 2016-09-30 ENCOUNTER — Encounter (HOSPITAL_COMMUNITY): Payer: Self-pay

## 2016-09-30 DIAGNOSIS — R519 Headache, unspecified: Secondary | ICD-10-CM

## 2016-09-30 DIAGNOSIS — F1290 Cannabis use, unspecified, uncomplicated: Secondary | ICD-10-CM | POA: Insufficient documentation

## 2016-09-30 DIAGNOSIS — R51 Headache: Secondary | ICD-10-CM | POA: Insufficient documentation

## 2016-09-30 DIAGNOSIS — R079 Chest pain, unspecified: Secondary | ICD-10-CM | POA: Insufficient documentation

## 2016-09-30 DIAGNOSIS — E119 Type 2 diabetes mellitus without complications: Secondary | ICD-10-CM | POA: Insufficient documentation

## 2016-09-30 DIAGNOSIS — F17210 Nicotine dependence, cigarettes, uncomplicated: Secondary | ICD-10-CM | POA: Insufficient documentation

## 2016-09-30 DIAGNOSIS — Z79899 Other long term (current) drug therapy: Secondary | ICD-10-CM | POA: Insufficient documentation

## 2016-09-30 LAB — COMPREHENSIVE METABOLIC PANEL
ALK PHOS: 96 U/L (ref 38–126)
ALT: 25 U/L (ref 14–54)
AST: 33 U/L (ref 15–41)
Albumin: 4 g/dL (ref 3.5–5.0)
Anion gap: 8 (ref 5–15)
BILIRUBIN TOTAL: 0.3 mg/dL (ref 0.3–1.2)
BUN: 14 mg/dL (ref 6–20)
CALCIUM: 9.4 mg/dL (ref 8.9–10.3)
CO2: 28 mmol/L (ref 22–32)
CREATININE: 0.74 mg/dL (ref 0.44–1.00)
Chloride: 101 mmol/L (ref 101–111)
Glucose, Bld: 152 mg/dL — ABNORMAL HIGH (ref 65–99)
Potassium: 4.1 mmol/L (ref 3.5–5.1)
SODIUM: 137 mmol/L (ref 135–145)
TOTAL PROTEIN: 7.6 g/dL (ref 6.5–8.1)

## 2016-09-30 LAB — TROPONIN I: Troponin I: <0.03 ng/mL (ref <0.03)

## 2016-09-30 LAB — CBC
HEMATOCRIT: 39.5 % (ref 36.0–46.0)
HEMOGLOBIN: 13 g/dL (ref 12.0–15.0)
MCH: 23.3 pg — AB (ref 26.0–34.0)
MCHC: 32.9 g/dL (ref 30.0–36.0)
MCV: 70.7 fL — AB (ref 78.0–100.0)
Platelets: 300 10*3/uL (ref 150–400)
RBC: 5.59 MIL/uL — AB (ref 3.87–5.11)
RDW: 16.1 % — ABNORMAL HIGH (ref 11.5–15.5)
WBC: 11.3 10*3/uL — ABNORMAL HIGH (ref 4.0–10.5)

## 2016-09-30 MED ORDER — MORPHINE SULFATE (PF) 4 MG/ML IV SOLN
4.0000 mg | Freq: Once | INTRAVENOUS | Status: AC
  Administered 2016-09-30: 4 mg via INTRAVENOUS
  Filled 2016-09-30: qty 1

## 2016-09-30 MED ORDER — KETOROLAC TROMETHAMINE 30 MG/ML IJ SOLN
30.0000 mg | Freq: Once | INTRAMUSCULAR | Status: AC
  Administered 2016-09-30: 30 mg via INTRAVENOUS
  Filled 2016-09-30: qty 1

## 2016-09-30 MED ORDER — METOCLOPRAMIDE HCL 5 MG/ML IJ SOLN
10.0000 mg | Freq: Once | INTRAMUSCULAR | Status: AC
  Administered 2016-09-30: 10 mg via INTRAVENOUS
  Filled 2016-09-30: qty 2

## 2016-09-30 NOTE — ED Triage Notes (Signed)
She reports severe frontal h/a x 2 days "so bad it makes me cry".  She mentions having two left-sided teeth pulled some two weeks ago "and my mouth feels better". She is alert and oriented x 4 with clear speech.

## 2016-09-30 NOTE — ED Provider Notes (Signed)
[facility] DEPT Provider Note   CSN: [id number] Arrival date & time: 09/30/16  1659     History   Chief Complaint Chief Complaint  Patient presents with  . Headache    HPI Mackenzie Castillo is a 45 y.o. female.  HPI Patient presents to the emergency department with complaints of frontal headache over the past 2 days.  Denies photophobia but has had severe migraine headaches before.  No significant improvement with over-the-counter medications.  She did have oral surgery 2 weeks ago and feels like she is healing appropriately from that.  She denies anterior neck pain.  She does report some rather constant chest pain over the past 24 hours that is described as a achy burning sensation in her midline chest without radiation or shortness of breath.  No exertional component to her chest pain.  No history of coronary disease.  She does have diabetes.   Past Medical History:  Diagnosis Date  . Diabetes mellitus without complication ([place])     There are no active problems to display for this patient.   Past Surgical History:  Procedure Laterality Date  . GASTRIC BYPASS  2005    OB History    No data available       Home Medications    Prior to Admission medications   Medication Sig Start Date End Date Taking? Authorizing Provider  cetirizine (ZYRTEC) 10 MG tablet Take 10 mg by mouth at bedtime.   Yes Historical Provider, MD  citalopram (CELEXA) 40 MG tablet Take 40 mg by mouth at bedtime.   Yes Historical Provider, MD  ibuprofen (ADVIL,MOTRIN) 200 MG tablet Take 600-800 mg by mouth every 6 (six) hours as needed for headache, mild pain or moderate pain.   Yes Historical Provider, MD  traZODone (DESYREL) 50 MG tablet Take 50 mg by mouth at bedtime as needed for sleep.   Yes Historical Provider, MD    Family History No family history on file.  Social History Social History  Substance Use Topics  . Smoking status: Heavy Tobacco Smoker    Packs/day: 1.00    Types:  Cigarettes  . Smokeless tobacco: Not on file  . Alcohol use Yes     Comment: socially     Allergies   Review of patient's allergies indicates no known allergies.   Review of Systems Review of Systems  All other systems reviewed and are negative.    Physical Exam Updated Vital Signs BP 147/82   Pulse 72   Temp 98.6 F (37 C) (Oral)   Resp 18   Ht 5\' 4"  (1.626 m)   Wt 250 lb (113.4 kg)   LMP 09/04/2016 (Approximate)   SpO2 97%   BMI 42.91 kg/m   Physical Exam  Constitutional: She is oriented to person, place, and time. She appears well-developed and well-nourished. No distress.  HENT:  Head: Normocephalic and atraumatic.  Eyes: EOM are normal. Pupils are equal, round, and reactive to light.  Neck: Normal range of motion.  Cardiovascular: Normal rate, regular rhythm and normal heart sounds.   Pulmonary/Chest: Effort normal and breath sounds normal.  Abdominal: Soft. She exhibits no distension. There is no tenderness.  Musculoskeletal: Normal range of motion.  Neurological: She is alert and oriented to person, place, and time.  5/5 strength in major muscle groups of  bilateral upper and lower extremities. Speech normal. No facial asymetry.   Skin: Skin is warm and dry.  Psychiatric: She has a normal mood and affect. Judgment normal.  Nursing note and vitals reviewed.    ED Treatments / Results  Labs (all labs ordered are listed, but only abnormal results are displayed) Labs Reviewed  CBC - Abnormal; Notable for the following:       Result Value   WBC 11.3 (*)    RBC 5.59 (*)    MCV 70.7 (*)    MCH 23.3 (*)    RDW 16.1 (*)    All other components within normal limits  COMPREHENSIVE METABOLIC PANEL - Abnormal; Notable for the following:    Glucose, Bld 152 (*)    All other components within normal limits  TROPONIN I    EKG  EKG Interpretation  Date/Time:  Saturday September 30 2016 19:19:17 EDT Ventricular Rate:  61 PR Interval:    QRS Duration: 87 QT  Interval:  413 QTC Calculation: 416 R Axis:   32 Text Interpretation:  Sinus rhythm Low voltage, precordial leads Baseline wander in lead(s) II No significant change was found Confirmed by Elgie Maziarz  MD, Lennette Bihari ([id number]) on 09/30/2016 9:17:13 PM       Radiology Dg Chest 2 View  Result Date: 09/30/2016 CLINICAL DATA:  Severe frontal headaches for 2 days. Left teeth pole 2 weeks ago. Mid chest pain. Diabetes. Smoker. EXAM: CHEST  2 VIEW COMPARISON:  02/12/2004 FINDINGS: The heart size and mediastinal contours are within normal limits. Both lungs are clear. The visualized skeletal structures are unremarkable. IMPRESSION: No active cardiopulmonary disease. Electronically Signed   By: Lucienne Capers M.D.   On: 09/30/2016 20:06    Procedures Procedures (including critical care time)  Medications Ordered in ED Medications  ketorolac (TORADOL) 30 MG/ML injection 30 mg (30 mg Intravenous Given 09/30/16 1923)  morphine 4 MG/ML injection 4 mg (4 mg Intravenous Given 09/30/16 1925)  metoCLOPramide (REGLAN) injection 10 mg (10 mg Intravenous Given 09/30/16 1925)     Initial Impression / Assessment and Plan / ED Course  I have reviewed the triage vital signs and the nursing notes.  Pertinent labs & imaging results that were available during my care of the patient were reviewed by me and considered in my medical decision making (see chart for details).  Clinical Course    Patient feels much better at this time.  Nonspecific headache.  Nonfocal neurologic exam.  No indication for imaging of the head.  Suspect migraine variant.  Feels better.  In regards to her chest pain this could represent gastroesophageal reflux disease.  Doubt ACS.  Doubt PE.  No indication for additional workup.  Troponin negative in the setting of constant chest pain.  EKG without ischemic changes.  Final Clinical Impressions(s) / ED Diagnoses   Final diagnoses:  Nonintractable headache, unspecified chronicity pattern,  unspecified headache type  Chest pain, unspecified type    New Prescriptions New Prescriptions   No medications on file     Jola Schmidt, MD 09/30/16 2135

## 2016-09-30 NOTE — ED Notes (Signed)
Bed: WLPT2 Expected date:  Expected time:  Means of arrival:  Comments: 

## 2016-09-30 NOTE — ED Notes (Signed)
Bed: WLPT1 Expected date:  Expected time:  Means of arrival:  Comments: 

## 2018-12-25 DIAGNOSIS — J189 Pneumonia, unspecified organism: Secondary | ICD-10-CM

## 2018-12-25 HISTORY — DX: Pneumonia, unspecified organism: J18.9

## 2020-02-13 ENCOUNTER — Encounter: Payer: Self-pay | Admitting: Internal Medicine

## 2020-02-14 ENCOUNTER — Other Ambulatory Visit: Payer: Self-pay

## 2020-02-14 ENCOUNTER — Ambulatory Visit
Admission: EM | Admit: 2020-02-14 | Discharge: 2020-02-14 | Disposition: A | Discharge status: Home / Self Care | Payer: Medicaid Other | Attending: Physician Assistant | Admitting: Physician Assistant

## 2020-02-14 ENCOUNTER — Encounter: Payer: Self-pay | Admitting: Emergency Medicine

## 2020-02-14 DIAGNOSIS — M545 Low back pain, unspecified: Secondary | ICD-10-CM

## 2020-02-14 MED ORDER — PREDNISONE 50 MG PO TABS: 50.0000 mg | ORAL_TABLET | Freq: Every day | ORAL | 0 refills | Status: DC

## 2020-02-14 MED ORDER — KETOROLAC TROMETHAMINE 30 MG/ML IJ SOLN
30.0000 mg | Freq: Once | INTRAMUSCULAR | Status: AC
  Administered 2020-02-14: 11:00:00 30 mg via INTRAMUSCULAR

## 2020-02-14 MED ORDER — TIZANIDINE HCL 4 MG PO TABS: 4.0000 mg | ORAL_TABLET | Freq: Three times a day (TID) | ORAL | 0 refills | Status: DC | PRN

## 2020-02-14 NOTE — Discharge Instructions (Signed)
Toradol injection in office today. Prednisone as directed. Discontinue flexeril and switch to tizanidine as needed, this can make you drowsy, so do not take if you are going to drive, operate heavy machinery, or make important decisions. Ice/heat compresses as needed. This can take up to 3-4 weeks to completely resolve, but you should be feeling better each week. Follow up with PCP/orthopedics if symptoms worsen, changes for reevaluation. If experience numbness/tingling of the inner thighs, loss of bladder or bowel control, go to the emergency department for evaluation.

## 2020-02-14 NOTE — ED Provider Notes (Signed)
[facility]    CSN: [id number] Arrival date & time: 02/14/20  0908      History   Chief Complaint Chief Complaint  Patient presents with  . Back Pain    HPI Mackenzie Castillo is a 49 y.o. female.   49 year old female comes in for 2-day history of low back pain.  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  She was treated with NSAIDs and Flexeril with good relief.  States has not had pain since, though has felt she can only stand 15 minutes at a time without need to sit down.  Denies any new injury or trauma.  She stopped the floor yesterday, without any significant pain.  However, later that day, started having bilateral low back pain that occasionally radiates to the leg.  Denies saddle anesthesia, loss of bladder or bowel control.  Took Flexeril without relief.  Took ibuprofen 800 mg without relief.  Took sister's gabapentin without relief.  History of noninsulin dependent DM, last A1c 7.1.     Past Medical History:  Diagnosis Date  . Diabetes mellitus without complication ([place])     There are no problems to display for this patient.   Past Surgical History:  Procedure Laterality Date  . GASTRIC BYPASS  2005    OB History   No obstetric history on file.      Home Medications    Prior to Admission medications   Medication Sig Start Date End Date Taking? Authorizing Provider  citalopram (CELEXA) 40 MG tablet Take 40 mg by mouth at bedtime.    [provider]  metFORMIN (GLUCOPHAGE) 500 MG tablet Take 1 tablet by mouth 2 (two) times daily with a meal. 11/06/19   [provider]  predniSONE (DELTASONE) 50 MG tablet Take 1 tablet (50 mg total) by mouth daily with breakfast. 02/14/20   Tasia Catchings, Alexy Bringle V, PA-C  tiZANidine (ZANAFLEX) 4 MG tablet Take 1 tablet (4 mg total) by mouth every 8 (eight) hours as needed for muscle spasms. 02/14/20   Ok Edwards, PA-C    Family History Family  History  Family history unknown: Yes    Social History Social History   Tobacco Use  . Smoking status: Heavy Tobacco Smoker    Packs/day: 1.00    Types: Cigarettes  . Smokeless tobacco: Never Used  Substance Use Topics  . Alcohol use: Yes    Comment: socially  . Drug use: Yes    Types: Marijuana     Allergies   Patient has no known allergies.   Review of Systems Review of Systems  Reason unable to perform ROS: See HPI as above.     Physical Exam Triage Vital Signs ED Triage Vitals  Enc Vitals Group     BP 02/14/20 0933 121/77     Pulse Rate 02/14/20 0933 83     Resp 02/14/20 0933 18     Temp 02/14/20 0933 98.2 F (36.8 C)     Temp Source 02/14/20 0933 Oral     SpO2 02/14/20 0933 98 %     Weight --      Height --      Head Circumference --      Peak Flow --      Pain Score 02/14/20 0934 10     Pain Loc --      Pain Edu? --      Excl. in [place]? --  No data found.  Updated Vital Signs BP 121/77 (BP Location: Right Arm)   Pulse 83   Temp 98.2 F (36.8 C) (Oral)   Resp 18   SpO2 98%   Physical Exam Constitutional:      General: She is not in acute distress.    Appearance: She is well-developed. She is not diaphoretic.  HENT:     Head: Normocephalic and atraumatic.  Eyes:     Conjunctiva/sclera: Conjunctivae normal.     Pupils: Pupils are equal, round, and reactive to light.  Cardiovascular:     Rate and Rhythm: Normal rate and regular rhythm.     Heart sounds: Normal heart sounds. No murmur. No friction rub. No gallop.   Pulmonary:     Effort: Pulmonary effort is normal. No accessory muscle usage or respiratory distress.     Breath sounds: Normal breath sounds. No stridor. No decreased breath sounds, wheezing, rhonchi or rales.  Musculoskeletal:     Comments: No swelling, erythema, warmth, contusion.  No tenderness to palpation of spinous processes.  Tenderness to palpation of lumbar region diffusely, right > left.  Due to pain, patient deferred  range of motion, and was unable to obtain hip or back range of motion.  The patient able to sit with hips flexed to 90 degrees.  She is able to ambulate on own without leg weakness.  Skin:    General: Skin is warm and dry.  Neurological:     Mental Status: She is alert and oriented to person, place, and time.      UC Treatments / Results  Labs (all labs ordered are listed, but only abnormal results are displayed) Labs Reviewed - No data to display  EKG   Radiology No results found.  Procedures Procedures (including critical care time)  Medications Ordered in UC Medications  ketorolac (TORADOL) 30 MG/ML injection 30 mg (30 mg Intramuscular Given 02/14/20 1037)    Initial Impression / Assessment and Plan / UC Course  I have reviewed the triage vital signs and the nursing notes.  Pertinent labs & imaging results that were available during my care of the patient were reviewed by me and considered in my medical decision making (see chart for details).    Given no new injury, will defer repeat x-rays.  Will provide Toradol injection in office today.  Prednisone to cover for possible sciatica.  Discussed this may increase CBG, and to continue to monitor.  Will have patient discontinue Flexeril, and switch to tizanidine.  Otherwise, return precautions given.  Patient expresses understanding and agrees to plan.  Final Clinical Impressions(s) / UC Diagnoses   Final diagnoses:  Acute bilateral low back pain, unspecified whether sciatica present   ED Prescriptions    Medication Sig Dispense Auth. Provider   predniSONE (DELTASONE) 50 MG tablet Take 1 tablet (50 mg total) by mouth daily with breakfast. 5 tablet Firas Guardado V, PA-C   tiZANidine (ZANAFLEX) 4 MG tablet Take 1 tablet (4 mg total) by mouth every 8 (eight) hours as needed for muscle spasms. 15 tablet Ok Edwards, PA-C     I have reviewed the PDMP during this encounter.   Ok Edwards, PA-C 02/14/20 1230

## 2020-02-14 NOTE — ED Triage Notes (Addendum)
Pt here for lower back pain with radiation to left leg after injury from December that still gives her pain; pain started yesterday

## 2020-02-20 ENCOUNTER — Telehealth: Payer: Self-pay

## 2020-02-20 NOTE — Telephone Encounter (Signed)
Called patient to do their pre-visit COVID screening.  Call went to voicemail. Unable to do prescreening.  

## 2020-02-23 ENCOUNTER — Ambulatory Visit: Payer: Self-pay | Admitting: Internal Medicine

## 2020-06-29 ENCOUNTER — Ambulatory Visit
Admission: RE | Admit: 2020-06-29 | Discharge: 2020-06-29 | Disposition: A | Discharge status: Home / Self Care | Payer: Medicaid Other | Source: Ambulatory Visit | Attending: Family Medicine | Admitting: Family Medicine

## 2020-06-29 ENCOUNTER — Other Ambulatory Visit: Payer: Self-pay

## 2020-06-29 ENCOUNTER — Other Ambulatory Visit: Payer: Self-pay | Admitting: Family Medicine

## 2020-06-29 DIAGNOSIS — M25511 Pain in right shoulder: Secondary | ICD-10-CM

## 2020-07-19 ENCOUNTER — Other Ambulatory Visit: Payer: Self-pay | Admitting: Nurse Practitioner

## 2020-07-19 DIAGNOSIS — R1900 Intra-abdominal and pelvic swelling, mass and lump, unspecified site: Secondary | ICD-10-CM

## 2020-07-20 ENCOUNTER — Other Ambulatory Visit: Payer: Self-pay | Admitting: Nurse Practitioner

## 2020-07-20 DIAGNOSIS — N644 Mastodynia: Secondary | ICD-10-CM

## 2020-08-06 ENCOUNTER — Other Ambulatory Visit: Payer: Self-pay

## 2020-08-06 ENCOUNTER — Ambulatory Visit
Admission: RE | Admit: 2020-08-06 | Discharge: 2020-08-06 | Disposition: A | Discharge status: Home / Self Care | Payer: Medicaid Other | Source: Ambulatory Visit | Attending: Nurse Practitioner | Admitting: Nurse Practitioner

## 2020-08-06 DIAGNOSIS — N644 Mastodynia: Secondary | ICD-10-CM

## 2020-08-10 ENCOUNTER — Other Ambulatory Visit: Payer: Medicaid Other

## 2020-09-08 ENCOUNTER — Ambulatory Visit: Payer: Medicaid Other | Attending: Neurological Surgery | Admitting: Physical Therapy

## 2020-09-08 ENCOUNTER — Other Ambulatory Visit: Payer: Self-pay

## 2020-09-08 ENCOUNTER — Encounter: Payer: Self-pay | Admitting: Physical Therapy

## 2020-09-08 DIAGNOSIS — M542 Cervicalgia: Secondary | ICD-10-CM | POA: Diagnosis not present

## 2020-09-08 DIAGNOSIS — R293 Abnormal posture: Secondary | ICD-10-CM | POA: Insufficient documentation

## 2020-09-08 DIAGNOSIS — R252 Cramp and spasm: Secondary | ICD-10-CM | POA: Insufficient documentation

## 2020-09-09 ENCOUNTER — Encounter: Payer: Self-pay | Admitting: Physical Therapy

## 2020-09-09 NOTE — Therapy (Signed)
[place], [place], [address] Phone: [phone]   Fax:  [phone]  Physical Therapy Evaluation  Patient Details  Name: Mackenzie Castillo MRN: [id number] Date of Birth: 04-Jul-1971 Referring Provider (PT): Dr Pieter Partridge Dawley    Encounter Date: 09/08/2020   PT End of Session - 09/08/20 1209    Visit Number 1    Number of Visits 12    Date for PT Re-Evaluation 10/20/20    PT Start Time 1100    PT Stop Time 1143    PT Time Calculation (min) 43 min    Activity Tolerance Patient tolerated treatment well    Behavior During Therapy [facility] for tasks assessed/performed           Past Medical History:  Diagnosis Date   Diabetes mellitus without complication ([place])     Past Surgical History:  Procedure Laterality Date   GASTRIC BYPASS  2005    There were no vitals filed for this visit.    Subjective Assessment - 09/08/20 1157    Subjective Patient had an onset of cerival pain in February of 2021. She has a history of fibromaylagia since 2014. She has had increased neck pain follwing a fall. She feels like she is loosing.    Pertinent History fibromaygia, DMII    How long can you stand comfortably? limited time standing before she has increased lower back pain    How long can you walk comfortably? depdnds on the day    Diagnostic tests MRI done at [facility]    Patient Stated Goals to have less pain and improved movement of the neck    Currently in Pain? Yes    Pain Score 5    can reach an 8-9/ 10 at times   Pain Location Neck    Pain Orientation Right    Pain Descriptors / Indicators Aching    Pain Type Chronic pain    Pain Onset 1 to 4 weeks ago    Aggravating Factors  yard work and daily tasks    Pain Relieving Factors gabapentin    Multiple Pain Sites No              [facility] PT Assessment - 09/09/20 0001      Assessment   Medical Diagnosis Cervical Pain     Referring Provider (PT) Dr Pieter Partridge  Dawley     Onset Date/Surgical Date --   increased pain in February    Hand Dominance Right    Next MD Visit scheduled for an injection next week.     Prior Therapy none       Precautions   Precautions None      Restrictions   Weight Bearing Restrictions No      Balance Screen   Has the patient fallen in the past 6 months No    Has the patient had a decrease in activity level because of a fear of falling?  No    Is the patient reluctant to leave their home because of a fear of falling?  No      Prior Function   Level of Independence Independent    Vocation Unemployed      Cognition   Overall Cognitive Status Within Functional Limits for tasks assessed    Attention Focused    Focused Attention Appears intact    Memory Appears intact    Awareness Appears intact    Problem Solving Appears intact  Observation/Other Assessments   Focus on Therapeutic Outcomes (FOTO)  Mediciad       Sensation   Light Touch Appears Intact      Coordination   Gross Motor Movements are Fluid and Coordinated Yes    Fine Motor Movements are Fluid and Coordinated Yes      Posture/Postural Control   Posture Comments rounded shoulders and forward head      ROM / Strength   AROM / PROM / Strength PROM;AROM;Strength      AROM   Overall AROM Comments full active ROM of bilateral UE     AROM Assessment Site Cervical    Cervical Flexion WNL     Cervical Extension 20 with pain     Cervical - Right Side Bend WNL     Cervical - Left Side Bend WNL    Cervical - Right Rotation 50 with pain     Cervical - Left Rotation 78       Strength   Overall Strength Comments 5/5 gross UE and LE ; grip strength 40 lbs bilaterall       Palpation   Palpation comment spasming into upper trap and cervical spine. Mild pain in the posterior shoulder                       Objective measurements completed on examination: See above findings.       [place] [facility] Adult PT Treatment/Exercise - 09/09/20  0001      Exercises   Exercises Neck      Neck Exercises: Stretches   Upper Trapezius Stretch 2 reps;20 seconds;Right    Levator Stretch 2 reps;20 seconds    Other Neck Stretches trigger point release with tennis ball.  reviewed trigger point Shepherds hook and where to purchase                   PT Education - 09/09/20 1159    Education Details reviewed HEP and symptom management    Person(s) Educated Patient    Methods Explanation;Demonstration;Tactile cues;Verbal cues    Comprehension Verbalized understanding;Returned demonstration;Verbal cues required;Tactile cues required            PT Short Term Goals - 09/09/20 1201      PT SHORT TERM GOAL #1   Title Patient will increase cervical rotation to the right by 10 degrees    Baseline 50 degrees right    Time 3    Period Weeks    Status New    Target Date 09/30/20      PT SHORT TERM GOAL #2   Title Patient will increase cervical extension bu 5 degrees    Baseline 20 degrees with pain    Time 3    Period Weeks    Status New    Target Date 09/30/20      PT SHORT TERM GOAL #3   Title Patient will be independent with basic HEP    Baseline has no HEP    Time 3    Period Weeks    Status New    Target Date 09/30/20             PT Long Term Goals - 09/09/20 1216      PT LONG TERM GOAL #1   Title Patient will stand and perfrom ADL's without increased neck pain    Baseline pain at times    Time 6    Period Weeks    Status New  Target Date 10/21/20      PT LONG TERM GOAL #2   Title Patient will increase bilateral cervical rotation to 70 degrees without pain    Time 6    Period Weeks    Status New    Target Date 10/21/20                  Plan - 09/09/20 0852    Clinical Impression Statement Patient is a 49 year old female with right cervical spine and shoulder pain. She has diffiuse pain in her back and hands 2nd to fibromyalgia. Her neck pain has been progressivley increasing since  [place]. She has decreased cervical rotation to the right and decreased cervical extension. She has spasming in her upper trap and into her posterior shoulder. She has full right shoulder motion and only minimal strength defcitis. 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    Examination-Activity Limitations Lift;Carry;Stand    Examination-Participation Restrictions Community Activity;Driving;Meal Prep;Laundry    Stability/Clinical Decision Making Evolving/Moderate complexity    Clinical Decision Making Moderate    Rehab Potential Good    PT Frequency 2x / week    PT Duration 6 weeks    PT Treatment/Interventions ADLs/Self Care Home Management;Electrical Stimulation;Ultrasound;Iontophoresis 4mg /ml Dexamethasone;Moist Heat;Neuromuscular re-education;Therapeutic exercise;Therapeutic activities;Functional mobility training;Patient/family education;Manual techniques;Passive range of motion;Dry needling;Taping    PT Next Visit Plan manual therapy to cervical spine; review self trigger poijt release    Consulted and Agree with Plan of Care Patient           Patient will benefit from skilled therapeutic intervention in order to improve the following deficits and impairments:  Decreased endurance, Pain, Impaired UE functional use, Decreased range of motion  Visit Diagnosis: Cervicalgia  Abnormal posture  Cramp and spasm     Problem List There are no problems to display for this patient.   Carney Living PT DPT  09/09/2020, 12:19 PM  [facility] [address] [place], [place], [address] Phone: [phone]   Fax:  [phone]  Name: Mackenzie Castillo MRN: [id number] Date of Birth: [birth year]

## 2020-09-16 ENCOUNTER — Ambulatory Visit: Payer: Medicaid Other

## 2020-09-20 ENCOUNTER — Ambulatory Visit
Admission: RE | Admit: 2020-09-20 | Discharge: 2020-09-20 | Disposition: A | Discharge status: Home / Self Care | Payer: Medicaid Other | Source: Ambulatory Visit | Attending: Nurse Practitioner | Admitting: Nurse Practitioner

## 2020-09-20 DIAGNOSIS — R1900 Intra-abdominal and pelvic swelling, mass and lump, unspecified site: Secondary | ICD-10-CM

## 2020-09-20 MED ORDER — IOPAMIDOL (ISOVUE-300) INJECTION 61%
100.0000 mL | Freq: Once | INTRAVENOUS | Status: AC | PRN
  Administered 2020-09-20: 100 mL via INTRAVENOUS

## 2020-09-23 ENCOUNTER — Ambulatory Visit: Payer: Medicaid Other

## 2020-09-23 ENCOUNTER — Other Ambulatory Visit: Payer: Self-pay

## 2020-09-23 DIAGNOSIS — M542 Cervicalgia: Secondary | ICD-10-CM | POA: Diagnosis not present

## 2020-09-23 DIAGNOSIS — R293 Abnormal posture: Secondary | ICD-10-CM

## 2020-09-23 NOTE — Therapy (Signed)
[place], [place], [address] Phone: [phone]   Fax:  [phone]  Physical Therapy Treatment  Patient Details  Name: Mackenzie Castillo MRN: [id number] Date of Birth: May 09, 1971 Referring Provider (PT): Dr Pieter Partridge Dawley    Encounter Date: 09/23/2020   PT End of Session - 09/23/20 1133    Visit Number 2    Number of Visits 12    Date for PT Re-Evaluation 10/20/20    PT Start Time 1132    PT Stop Time 4132    PT Time Calculation (min) 48 min    Activity Tolerance Patient tolerated treatment well    Behavior During Therapy [facility] for tasks assessed/performed           Past Medical History:  Diagnosis Date   Diabetes mellitus without complication ([place])     Past Surgical History:  Procedure Laterality Date   GASTRIC BYPASS  2005    There were no vitals filed for this visit.   Subjective Assessment - 09/23/20 1135    Subjective No changes since last visit. Stiffness  has eased some with HEP.  She wants to know if benefit from [name].    Pertinent History fibromaygia, DMII. Fibroind in bck    Pain Score 3     Pain Location Neck    Pain Orientation Posterior;Right    Pain Descriptors / Indicators Aching    Pain Type Chronic pain    Pain Onset More than a month ago   6 months   Aggravating Factors  yard work and home tasks    Pain Relieving Factors nothing during day                             [facility] Adult PT Treatment/Exercise - 09/23/20 0001      Neck Exercises: Seated   Other Seated Exercise Rotation and side bend stretch and cross body pull of shoulder.       Modalities   Modalities Moist Heat      Moist Heat Therapy   Number Minutes Moist Heat 12 Minutes    Moist Heat Location Cervical   needed extra pillow case     Manual Therapy   Manual Therapy Joint mobilization;Soft tissue mobilization;Passive ROM;Manual Traction    Joint Mobilization PA central and lateral  upper thoracic and cervical .   SNAGS RT?LT for rotation and rotation was full ROm with this.     Soft tissue mobilization upper thoracic and cervical RT >Lt     Passive ROM rotation nad sidebending gentle    Manual Traction cervical      Neck Exercises: Stretches   Upper Trapezius Stretch 2 reps;20 seconds;Right    Levator Stretch 2 reps;20 seconds                    PT Short Term Goals - 09/09/20 1201      PT SHORT TERM GOAL #1   Title Patient will increase cervical rotation to the right by 10 degrees    Baseline 50 degrees right    Time 3    Period Weeks    Status New    Target Date 09/30/20      PT SHORT TERM GOAL #2   Title Patient will increase cervical extension bu 5 degrees    Baseline 20 degrees with pain    Time 3    Period Weeks    Status  New    Target Date 09/30/20      PT SHORT TERM GOAL #3   Title Patient will be independent with basic HEP    Baseline has no HEP    Time 3    Period Weeks    Status New    Target Date 09/30/20             PT Long Term Goals - 09/09/20 1216      PT LONG TERM GOAL #1   Title Patient will stand and perfrom ADL's without increased neck pain    Baseline pain at times    Time 6    Period Weeks    Status New    Target Date 10/21/20      PT LONG TERM GOAL #2   Title Patient will increase bilateral cervical rotation to 70 degrees without pain    Time 6    Period Weeks    Status New    Target Date 10/21/20                 Plan - 09/23/20 1141    Clinical Impression Statement Much of her pain may be realted to stress as she mentioned this more than once. She reported decreased pain and stiffness at end of session.  Need to cont manual and probably try dry needling   Possible add  band or stab exercises. Stopped heat early as this was too hot and becanme uncomfortable    PT Treatment/Interventions ADLs/Self Care Home Management;Electrical Stimulation;Ultrasound;Iontophoresis 4mg /ml Dexamethasone;Moist  Heat;Neuromuscular re-education;Therapeutic exercise;Therapeutic activities;Functional mobility training;Patient/family education;Manual techniques;Passive range of motion;Dry needling;Taping    PT Next Visit Plan manual therapy to cervical spine; review self trigger point  release, crrvical stab exercises?    PT Home Exercise Plan Cervical side bend and rotation, cross body stretch.    Consulted and Agree with Plan of Care Patient           Patient will benefit from skilled therapeutic intervention in order to improve the following deficits and impairments:  Decreased endurance, Pain, Impaired UE functional use, Decreased range of motion  Visit Diagnosis: Cervicalgia  Abnormal posture     Problem List There are no problems to display for this patient.   Darrel Hoover  PT 09/23/2020, 12:14 PM  [facility] [address] [place], [place], [address] Phone: [phone]   Fax:  [phone]  Name: Mackenzie Castillo MRN: [id number] Date of Birth: 1971/09/21

## 2020-09-29 ENCOUNTER — Ambulatory Visit: Payer: Medicaid Other | Attending: Neurological Surgery | Admitting: Physical Therapy

## 2020-09-29 ENCOUNTER — Other Ambulatory Visit: Payer: Self-pay

## 2020-09-29 DIAGNOSIS — R252 Cramp and spasm: Secondary | ICD-10-CM | POA: Insufficient documentation

## 2020-09-29 DIAGNOSIS — R293 Abnormal posture: Secondary | ICD-10-CM | POA: Insufficient documentation

## 2020-09-29 DIAGNOSIS — M542 Cervicalgia: Secondary | ICD-10-CM | POA: Diagnosis not present

## 2020-09-30 ENCOUNTER — Encounter: Payer: Self-pay | Admitting: Physical Therapy

## 2020-09-30 NOTE — Therapy (Signed)
[place], [place], [address] Phone: [phone]   Fax:  [phone]  Physical Therapy Treatment  Patient Details  Name: Mackenzie Castillo MRN: [id number] Date of Birth: 04-29-1971 Referring Provider (PT): Dr Pieter Partridge Dawley    Encounter Date: 09/29/2020   PT End of Session - 09/30/20 1027    Visit Number 3    Number of Visits 12    Date for PT Re-Evaluation 10/20/20    PT Start Time 1145    PT Stop Time 1226    PT Time Calculation (min) 41 min    Activity Tolerance Patient tolerated treatment well    Behavior During Therapy [facility] for tasks assessed/performed           Past Medical History:  Diagnosis Date  . Diabetes mellitus without complication [facility])     Past Surgical History:  Procedure Laterality Date  . GASTRIC BYPASS  2005    There were no vitals filed for this visit.   Subjective Assessment - 09/30/20 1010    Subjective Patient reports it may be a little looser but she is still having pain. She feels like she has been more stressed lately.    Pertinent History fibromaygia, DMII. 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 limited time standing before she has increased lower back pain    How long can you walk comfortably? depdnds on the day    Diagnostic tests MRI done at [facility]    Patient Stated Goals to have less pain and improved movement of the neck    Currently in Pain? Yes    Pain Score 3     Pain Location Neck    Pain Orientation Right;Posterior    Pain Descriptors / Indicators Aching    Pain Type Chronic pain    Pain Onset More than a month ago    Pain Frequency Constant    Aggravating Factors  yard work and home tasks    Pain Relieving Factors nothing during the day                             [facility] Adult PT Treatment/Exercise - 09/30/20 0001      Neck Exercises: Standing   Other Standing Exercises row yellow 2x10; shoulder extension  2x10 yellow       Neck Exercises: Seated   Other Seated Exercise seated er yellow 2x10       Moist Heat Therapy   Number Minutes Moist Heat --   declined      Manual Therapy   Manual Therapy Joint mobilization;Soft tissue mobilization;Passive ROM;Manual Traction    Joint Mobilization PA central and lateral upper thoracic and cervical .   SNAGS RT?LT for rotation and rotation was full ROm with this.     Soft tissue mobilization upper thoracic and cervical RT >Lt     Passive ROM rotation nad sidebending gentle    Manual Traction cervical      Neck Exercises: Stretches   Upper Trapezius Stretch 2 reps;20 seconds;Right    Levator Stretch 2 reps;20 seconds                  PT Education - 09/30/20 1026    Education Details reviewed light HEP    Person(s) Educated Patient    Methods Explanation;Demonstration;Tactile cues;Verbal cues    Comprehension Returned demonstration;Verbal cues required;Tactile cues required;Verbalized understanding  PT Short Term Goals - 09/09/20 1201      PT SHORT TERM GOAL #1   Title Patient will increase cervical rotation to the right by 10 degrees    Baseline 50 degrees right    Time 3    Period Weeks    Status New    Target Date 09/30/20      PT SHORT TERM GOAL #2   Title Patient will increase cervical extension bu 5 degrees    Baseline 20 degrees with pain    Time 3    Period Weeks    Status New    Target Date 09/30/20      PT SHORT TERM GOAL #3   Title Patient will be independent with basic HEP    Baseline has no HEP    Time 3    Period Weeks    Status New    Target Date 09/30/20             PT Long Term Goals - 09/09/20 1216      PT LONG TERM GOAL #1   Title Patient will stand and perfrom ADL's without increased neck pain    Baseline pain at times    Time 6    Period Weeks    Status New    Target Date 10/21/20      PT LONG TERM GOAL #2   Title Patient will increase bilateral cervical rotation to 70  degrees without pain    Time 6    Period Weeks    Status New    Target Date 10/21/20                 Plan - 09/30/20 1028    Clinical Impression Statement Patient continues to have tightness and spasming inher upper trpas. She feels like she hasn';t made much progress but her cervicl rotation has improved and she has more active use of her shoulders. She was encouraged that this is great progress given she has only had 2 visits. She was given light strengthening to begin. She is motivated to begin exercise but concerned about her fiboromyalgia. 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    Examination-Activity Limitations Lift;Carry;Stand    Examination-Participation Restrictions Community Activity;Driving;Meal Prep;Laundry    Stability/Clinical Decision Making Evolving/Moderate complexity    Clinical Decision Making Moderate    Rehab Potential Good    PT Frequency 2x / week    PT Duration 6 weeks    PT Treatment/Interventions ADLs/Self Care Home Management;Electrical Stimulation;Ultrasound;Iontophoresis 4mg /ml Dexamethasone;Moist Heat;Neuromuscular re-education;Therapeutic exercise;Therapeutic activities;Functional mobility training;Patient/family education;Manual techniques;Passive range of motion;Dry needling;Taping    PT Next Visit Plan manual therapy to cervical spine; review self trigger point  release, crrvical stab exercises?    PT Home Exercise Plan Cervical side bend and rotation, cross body stretch.    Consulted and Agree with Plan of Care Patient           Patient will benefit from skilled therapeutic intervention in order to improve the following deficits and impairments:  Decreased endurance, Pain, Impaired UE functional use, Decreased range of motion  Visit  Diagnosis: Cervicalgia  Abnormal posture  Cramp and spasm     Problem List There are no problems to display for this patient.   Grayling Congress CarrollPT DPT  09/30/2020, 10:36 AM  [facility] [address]  [place], [place], [address] Phone: [phone]   Fax:  [phone]  Name: Mackenzie Castillo MRN: [id number] Date of Birth: 09-04-1971

## 2020-10-04 ENCOUNTER — Ambulatory Visit: Payer: Medicaid Other

## 2020-10-04 ENCOUNTER — Other Ambulatory Visit: Payer: Self-pay

## 2020-10-04 DIAGNOSIS — R293 Abnormal posture: Secondary | ICD-10-CM

## 2020-10-04 DIAGNOSIS — R252 Cramp and spasm: Secondary | ICD-10-CM

## 2020-10-04 DIAGNOSIS — M542 Cervicalgia: Secondary | ICD-10-CM | POA: Diagnosis not present

## 2020-10-04 NOTE — Therapy (Signed)
[place], [place], [address] Phone: [phone]   Fax:  [phone]  Physical Therapy Treatment  Patient Details  Name: LEOLA FIORE MRN: [id number] Date of Birth: [birth year] Referring Provider (PT): Dr Pieter Partridge Dawley    Encounter Date: 10/04/2020   PT End of Session - 10/04/20 1036    Visit Number 4    Number of Visits 12    Date for PT Re-Evaluation 10/20/20    PT Start Time 5974    PT Stop Time 1130    PT Time Calculation (min) 50 min    Activity Tolerance Patient tolerated treatment well    Behavior During Therapy [facility] for tasks assessed/performed           Past Medical History:  Diagnosis Date  . Diabetes mellitus without complication [facility])     Past Surgical History:  Procedure Laterality Date  . GASTRIC BYPASS  2005    There were no vitals filed for this visit.   Subjective Assessment - 10/04/20 1038    Subjective Less tightness    Pain Score 3     Pain Location Neck    Pain Orientation Right;Posterior    Pain Descriptors / Indicators Aching    Pain Type Chronic pain    Pain Onset More than a month ago    Pain Frequency Constant    Aggravating Factors  home activity              [facility] PT Assessment - 10/04/20 0001      AROM   Cervical - Right Rotation 70    Cervical - Left Rotation 70                         [facility] Adult PT Treatment/Exercise - 10/04/20 0001      Neck Exercises: Machines for Strengthening   UBE (Upper Arm Bike) L1   min      Neck Exercises: Standing   Other Standing Exercises red band ER /shoulder ext and row x 15       Neck Exercises: Seated   Lateral Flexion Right    Lateral Flexion Limitations 30 sec then levator x 30 sec    Other Seated Exercise seated er red 2x10       Modalities   Modalities Traction      Traction   Type of Traction Cervical    Min (lbs) 5    Max (lbs) 14    Hold Time 60    Rest Time 15    Time 10       Manual Therapy   Joint Mobilization PA central and lateral upper thoracic and cervical .   SNAGS RT?LT for rotation and rotation was full ROm with this.     Soft tissue mobilization upper thoracic and cervical RT >Lt     Passive ROM rotation nad sidebending gentle    Manual Traction cervical      Neck Exercises: Stretches   Upper Trapezius Stretch 2 reps;20 seconds;Right    Levator Stretch 2 reps;20 seconds                    PT Short Term Goals - 10/04/20 1122      PT SHORT TERM GOAL #1   Title Patient will increase cervical rotation to the right by 10 degrees    Baseline 70 today    Status Achieved      PT  SHORT TERM GOAL #3   Title Patient will be independent with basic HEP    Status Achieved             PT Long Term Goals - 09/09/20 1216      PT LONG TERM GOAL #1   Title Patient will stand and perfrom ADL's without increased neck pain    Baseline pain at times    Time 6    Period Weeks    Status New    Target Date 10/21/20      PT LONG TERM GOAL #2   Title Patient will increase bilateral cervical rotation to 70 degrees without pain    Time 6    Period Weeks    Status New    Target Date 10/21/20                 Plan - 10/04/20 1037    Clinical Impression Statement She reports decreased tightness but pain remains about the same . She was able to do all exercise without oincreased pain . trial of mechanical traction( no charge for this not covered by [employer])   Assess benefit fronUBE and traction    PT Treatment/Interventions ADLs/Self Care Home Management;Electrical Stimulation;Ultrasound;Iontophoresis 4mg /ml Dexamethasone;Moist Heat;Neuromuscular re-education;Therapeutic exercise;Therapeutic activities;Functional mobility training;Patient/family education;Manual techniques;Passive range of motion;Dry needling;Taping    PT Next Visit Plan manual therapy to cervical spine; review self trigger point  release, crrvical stab exercises    PT  Home Exercise Plan Cervical side bend and rotation, cross body stretch.  issued red and green band for progress at home.    Consulted and Agree with Plan of Care Patient           Patient will benefit from skilled therapeutic intervention in order to improve the following deficits and impairments:  Decreased endurance, Pain, Impaired UE functional use, Decreased range of motion  Visit Diagnosis: Cervicalgia  Abnormal posture  Cramp and spasm     Problem List There are no problems to display for this patient.   Darrel Hoover  PT 10/04/2020, 11:22 AM  [facility] [address] [place], [place], [address] Phone: [phone]   Fax:  [phone]  Name: HARPREET SIGNORE MRN: [id number] Date of Birth: 07/31/1971

## 2020-10-11 ENCOUNTER — Ambulatory Visit: Payer: Medicaid Other

## 2020-10-11 ENCOUNTER — Other Ambulatory Visit: Payer: Self-pay

## 2020-10-11 DIAGNOSIS — M542 Cervicalgia: Secondary | ICD-10-CM

## 2020-10-11 DIAGNOSIS — R252 Cramp and spasm: Secondary | ICD-10-CM

## 2020-10-11 DIAGNOSIS — R293 Abnormal posture: Secondary | ICD-10-CM

## 2020-10-11 NOTE — Therapy (Signed)
[place], [place], [address] Phone: [phone]   Fax:  [phone]  Physical Therapy Treatment  Patient Details  Name: Mackenzie Castillo MRN: [id number] Date of Birth: 1971-07-25 Referring Provider (PT): Dr Pieter Partridge Dawley    Encounter Date: 10/11/2020   PT End of Session - 10/11/20 0739    Visit Number 5    Number of Visits 12    Date for PT Re-Evaluation 10/20/20    PT Start Time 0740    PT Stop Time 0818    PT Time Calculation (min) 38 min    Activity Tolerance Patient tolerated treatment well    Behavior During Therapy [facility] for tasks assessed/performed           Past Medical History:  Diagnosis Date  . Diabetes mellitus without complication [facility])     Past Surgical History:  Procedure Laterality Date  . GASTRIC BYPASS  2005    There were no vitals filed for this visit.   Subjective Assessment - 10/11/20 0740    Subjective Patient states "I am doing well" but reports her neck pain on the right side is still present. Pt reports "the traction just felt like it wasn't grabbing right where I was feeling my pain." Pt states she mowed her mother's lawn over the weekend, which included a lot of twisting and turning. Pt tolerated riding the lawnmower but avoided turning her head more than she needed to.    Pertinent History fibromaygia, DMII. 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 limited time standing before she has increased lower back pain    How long can you walk comfortably? depdnds on the day    Diagnostic tests [facility]    Patient Stated Goals to have less pain and improved movement of the neck    Currently in Pain? Yes    Pain Score 3     Pain Location Neck    Pain Orientation Right;Posterior    Pain Descriptors / Indicators Aching    Pain Type Chronic pain    Pain Onset More than a month ago              [facility] PT Assessment - 10/11/20 0001       Assessment   Medical Diagnosis Cervical Pain     Referring Provider (PT) Dr Elwin Sleight                          [facility] Adult PT Treatment/Exercise - 10/11/20 0001      Self-Care   Self-Care Other Self-Care Comments    Other Self-Care Comments  Patient education provided regarding R upper trap and levator stretches, self myofascial release using tennis ball, and posture      Neck Exercises: Machines for Strengthening   UBE (Upper Arm Bike) L 1.5 3 min forward, 3 min backward      Neck Exercises: Standing   Other Standing Exercises red band ER /shoulder ext and row x 15     Other Standing Exercises standing Ys at wall x 15      Neck Exercises: Seated   Other Seated Exercise seated er red 2x10     Other Seated Exercise --      Neck Exercises: Supine   Neck Retraction 20 reps    Neck Retraction Limitations supine retractions into pillow      Manual Therapy   Manual  Therapy Soft tissue mobilization;Myofascial release    Myofascial Release Myofascial release and STM to R levator scap, upper trap; suboccipital release    Manual Traction Cervical distraction      Neck Exercises: Stretches   Upper Trapezius Stretch 2 reps;Right;30 seconds    Levator Stretch 2 reps;30 seconds    Corner Stretch 2 reps;30 seconds   at door frame                 PT Education - 10/11/20 0820    Education Details Patient education provided regarding R upper trap and levator stretches, self myofascial release using tennis ball, and posture. Discussed the option of dry needling if pt is interested.    Person(s) Educated Patient    Methods Explanation;Demonstration;Tactile cues;Verbal cues    Comprehension Verbalized understanding;Returned demonstration;Verbal cues required;Tactile cues required            PT Short Term Goals - 10/04/20 1122      PT SHORT TERM GOAL #1   Title Patient will increase cervical rotation to the right by 10 degrees    Baseline 70 today    Status  Achieved      PT SHORT TERM GOAL #3   Title Patient will be independent with basic HEP    Status Achieved             PT Long Term Goals - 09/09/20 1216      PT LONG TERM GOAL #1   Title Patient will stand and perfrom ADL's without increased neck pain    Baseline pain at times    Time 6    Period Weeks    Status New    Target Date 10/21/20      PT LONG TERM GOAL #2   Title Patient will increase bilateral cervical rotation to 70 degrees without pain    Time 6    Period Weeks    Status New    Target Date 10/21/20                 Plan - 10/11/20 0745    Clinical Impression Statement Patient tolerated treatment well with reports of fatigue but no increase in pain during manual therapy and interventions. Pt tolerated UBE and progression of exercises well.    PT Treatment/Interventions ADLs/Self Care Home Management;Electrical Stimulation;Ultrasound;Iontophoresis 4mg /ml Dexamethasone;Moist Heat;Neuromuscular re-education;Therapeutic exercise;Therapeutic activities;Functional mobility training;Patient/family education;Manual techniques;Passive range of motion;Dry needling;Taping    PT Next Visit Plan manual therapy to cervical spine; review self trigger point release, cervical stab exercises, STM and myofascial release to R levator scap and upper trap; ask pt about interest in dry needling    PT Home Exercise Plan Cervical side bend and rotation, cross body stretch. issued red and green band for progress at home.    Consulted and Agree with Plan of Care Patient           Patient will benefit from skilled therapeutic intervention in order to improve the following deficits and impairments:  Decreased endurance, Pain, Impaired UE functional use, Decreased range of motion  Visit Diagnosis: Cervicalgia  Abnormal posture  Cramp and spasm     Problem List There are no problems to display for this patient.  Haydee Monica, PT, DPT 10/11/20 8:29 AM  [facility]  [facility] [address] [place], [place], [address] Phone: [phone]   Fax:  [phone]  Name: Mackenzie Castillo MRN: [id number] Date of Birth: July 31, 1971

## 2020-10-18 ENCOUNTER — Other Ambulatory Visit: Payer: Self-pay

## 2020-10-18 ENCOUNTER — Ambulatory Visit: Payer: Medicaid Other | Admitting: Physical Therapy

## 2020-10-18 ENCOUNTER — Encounter: Payer: Self-pay | Admitting: Physical Therapy

## 2020-10-18 DIAGNOSIS — M542 Cervicalgia: Secondary | ICD-10-CM | POA: Diagnosis not present

## 2020-10-18 DIAGNOSIS — R252 Cramp and spasm: Secondary | ICD-10-CM

## 2020-10-18 DIAGNOSIS — R293 Abnormal posture: Secondary | ICD-10-CM

## 2020-10-18 NOTE — Therapy (Addendum)
[place], [place], [address] Phone: [phone]   Fax:  [phone]  Physical Therapy Treatment  Patient Details  Name: Mackenzie Castillo MRN: [id number] Date of Birth: 13-Aug-1971 Referring Provider (PT): Dr Pieter Partridge Dawley    Encounter Date: 10/18/2020   PT End of Session - 10/18/20 1216    Visit Number 6    Number of Visits 12    Date for PT Re-Evaluation 10/20/20    PT Start Time 8916    PT Stop Time 1229    PT Time Calculation (min) 44 min    Activity Tolerance Patient tolerated treatment well    Behavior During Therapy [facility] for tasks assessed/performed           Past Medical History:  Diagnosis Date  . Diabetes mellitus without complication [facility])     Past Surgical History:  Procedure Laterality Date  . GASTRIC BYPASS  2005    There were no vitals filed for this visit.   Subjective Assessment - 10/18/20 1147    Subjective Pt reports she worked in the yard too hard this weekend and is feeling it a little bit; more in left than right neck    Pertinent History fibromaygia, DMII. Fibro ind in bck    How long can you stand comfortably? limited time standing before she has increased lower back pain    How long can you walk comfortably? depdnds on the day    Diagnostic tests [facility]    Patient Stated Goals to have less pain and improved movement of the neck    Currently in Pain? No/denies    Pain Score 0-No pain    Pain Location Neck    Pain Orientation Right;Posterior    Pain Descriptors / Indicators Aching    Pain Onset More than a month ago              [facility] PT Assessment - 10/18/20 0001      AROM   Cervical Extension 32   Spasming   Cervical - Right Rotation 68    Cervical - Left Rotation 68                         [facility] Adult PT Treatment/Exercise - 10/18/20 0001      Neck Exercises: Theraband   Shoulder Extension 20 reps;Red    Shoulder  Extension Limitations Cue to step backwards and start around 60deg shoulder flexion    Rows 20 reps;Red    Rows Limitations focus on scapular retraction and keeping shoulders low    Shoulder External Rotation 20 reps;Red    Shoulder External Rotation Limitations Started hands together, full supination 20 reps; then hands apart, full pronation 20 reps      Neck Exercises: Seated   Shoulder Flexion Both;20 reps    Shoulder Flexion Weights (lbs) Red theraband      Manual Therapy   Manual Therapy Soft tissue mobilization;Myofascial release    Manual therapy comments IASTM    Rhomboids, LS, UT, bilaterally   Myofascial Release Myofascial release and STM to R levator scap, upper trap; suboccipital release    Manual Traction Cervical distraction, suboccipital release, CPAs of C3-C7      Neck Exercises: Stretches   Upper Trapezius Stretch 2 reps;Right;30 seconds                  PT Education - 10/18/20 1219  Education Details Education on cervical distraction and TPDN    Person(s) Educated Patient    Methods Explanation    Comprehension Verbalized understanding            PT Short Term Goals - 10/04/20 1122      PT SHORT TERM GOAL #1   Title Patient will increase cervical rotation to the right by 10 degrees    Baseline 70 today    Status Achieved      PT SHORT TERM GOAL #3   Title Patient will be independent with basic HEP    Status Achieved             PT Long Term Goals - 10/18/20 1300      PT LONG TERM GOAL #1   Title Patient will stand and perfrom ADLs without increased neck pain    Baseline pain at times    Time 6    Period Weeks    Status On-going      PT LONG TERM GOAL #2   Title Patient will increase bilateral cervical rotation to 70 degrees without pain    Time 6    Period Weeks    Status Achieved                 Plan - 10/18/20 1217    Clinical Impression Statement Pt tolerated treatment well with no increase in pain during manual  therapy or exercises. Pt. responded positively to manual therapy and IASTM of the periscapular muscular bilaterally. Pt went through exercises including scapular retractions, rows, bilateral shoulder flexion, and external rotation; these were well-tolerated with minimal cuing. Patient was verbally consented for TPDN of left UT and a strong twitch response was elicited. Patient has demonstrated increased cervical range of motion into rotation bilaterally and increased extension, all of which are with decreased pain report. Patient will benefit from continued therapy to increase functional gains for ADL independence and decreased pain report for quality of life.    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    Examination-Activity Limitations Lift;Carry;Stand    Examination-Participation Restrictions Community Activity;Driving;Meal Prep;Laundry    Stability/Clinical Decision Making Evolving/Moderate complexity    Clinical Decision Making Moderate    Rehab Potential Good    PT Frequency 2x / week    PT Treatment/Interventions ADLs/Self Care Home Management;Electrical Stimulation;Ultrasound;Iontophoresis 4mg /ml Dexamethasone;Moist Heat;Neuromuscular re-education;Therapeutic exercise;Therapeutic activities;Functional mobility training;Patient/family education;Manual techniques;Passive range of motion;Dry needling;Taping    PT Next Visit Plan followup about TPDN, manual therapy to cervical spine; cervical stab exercises, STM and myofascial release to R levator scap and upper trap    PT Home Exercise Plan Cervical lateral flexion and rotation bilaterally, cross body stretch. issued red theraband for HEP    Consulted and Agree with Plan of Care Patient           Patient will benefit from skilled therapeutic intervention in order to improve the following deficits and impairments:  Decreased endurance,  Pain, Impaired UE functional use, Decreased range of motion  Visit Diagnosis: Cervicalgia  Abnormal posture  Cramp and spasm  Check all possible CPT codes: 97110- Therapeutic Exercise, [id number]- Neuro Re-education, [phone] - Gait Training, 97140 - Manual Therapy, 97530 - Therapeutic Activities, 97535 - Self Care, 97012 - Mechanical traction, 97014 - Electrical stimulation (unattended), [id number] - Electrical stimulation (Manual), [id number] - Iontophoresis, [id number] - Ultrasound and 78588 - Vaso  Problem List There are no problems to display for this patient.   Dawayne Cirri, SPT 10/18/2020, 1:02 PM  [facility] [address] [place], [place], [address] Phone: [phone]   Fax:  [phone]  Name: SAPPHIRE TYGART MRN: [id number] Date of Birth: [birth year]

## 2020-10-27 ENCOUNTER — Ambulatory Visit: Payer: Medicaid Other | Attending: Neurological Surgery | Admitting: Physical Therapy

## 2020-10-27 ENCOUNTER — Other Ambulatory Visit: Payer: Self-pay

## 2020-10-27 ENCOUNTER — Encounter: Payer: Self-pay | Admitting: Physical Therapy

## 2020-10-27 DIAGNOSIS — M542 Cervicalgia: Secondary | ICD-10-CM | POA: Diagnosis present

## 2020-10-27 DIAGNOSIS — R293 Abnormal posture: Secondary | ICD-10-CM | POA: Diagnosis present

## 2020-10-27 DIAGNOSIS — R252 Cramp and spasm: Secondary | ICD-10-CM | POA: Insufficient documentation

## 2020-10-27 NOTE — Therapy (Signed)
[place], [place], [address] Phone: [phone]   Fax:  [phone]  Physical Therapy Treatment  Patient Details  Name: Mackenzie Castillo MRN: [id number] Date of Birth: 1971/04/24 Referring Provider (PT): Dr Pieter Partridge Dawley    Encounter Date: 10/27/2020   PT End of Session - 10/27/20 1436    Visit Number 7    Number of Visits 12    Date for PT Re-Evaluation 12/13/20    PT Start Time 2694    PT Stop Time 1459    PT Time Calculation (min) 44 min    Activity Tolerance Patient tolerated treatment well    Behavior During Therapy [facility] for tasks assessed/performed           Past Medical History:  Diagnosis Date  . Diabetes mellitus without complication [facility])     Past Surgical History:  Procedure Laterality Date  . GASTRIC BYPASS  2005    There were no vitals filed for this visit.   Subjective Assessment - 10/27/20 1433    Subjective Patient reportds the needling worked well. She has felt much looser. She has been working on IT trainer and exercises.    Pertinent History fibromaygia, DMII. Fibro ind in bck    How long can you stand comfortably? limited time standing before she has increased lower back pain    How long can you walk comfortably? depdnds on the day    Diagnostic tests MRI done at [facility]    Patient Stated Goals to have less pain and improved movement of the neck    Currently in Pain? No/denies                             [facility] Adult PT Treatment/Exercise - 10/27/20 0001      Neck Exercises: Standing   Other Standing Exercises scap retraction blue 2x10; shoulder extetnsion blue 2x10     Other Standing Exercises Standing Y's 2x10 2lb I's 2x10 2lb       Manual Therapy   Manual Therapy Soft tissue mobilization;Myofascial release    Manual therapy comments IASTM    Rhomboids, LS, UT, bilaterally   Myofascial Release Myofascial release and STM to R levator scap,  upper trap; suboccipital release    Manual Traction Cervical distraction, suboccipital release, CPAs of C3-C7      Neck Exercises: Stretches   Upper Trapezius Stretch 2 reps;Right;30 seconds            Trigger Point Dry Needling - 10/27/20 0001    Consent Given? Yes    Education Handout Provided Yes    Muscles Treated Head and Neck Upper trapezius;Cervical multifidi    Other Dry Needling 2 spots in left ut and 1 spot in the right 2 spots in left cervical multidifi .30x50 needle     Upper Trapezius Response Twitch reponse elicited;Palpable increased muscle length    Cervical multifidi Response Twitch reponse elicited                PT Education - 10/27/20 1436    Education Details reviewed HEP and symptom mangement    Person(s) Educated Patient    Methods Explanation;Demonstration;Tactile cues;Verbal cues    Comprehension Verbalized understanding;Returned demonstration;Verbal cues required;Tactile cues required            PT Short Term Goals - 10/18/20 1507      PT SHORT TERM GOAL #1   Title Patient will  increase cervical rotation to the right by 10 degrees    Baseline 72 degrees    Time 3    Period Weeks    Status Achieved      PT SHORT TERM GOAL #2   Title Patient will increase cervical extension bu 5 degrees    Baseline 38 today    Time 3    Period Weeks    Status Achieved    Target Date 09/30/20      PT SHORT TERM GOAL #3   Title Patient will be independent with basic HEP    Baseline perfroming exercises at home    Time 3    Period Weeks    Status Achieved    Target Date 09/30/20             PT Long Term Goals - 10/18/20 1300      PT LONG TERM GOAL #1   Title Patient will stand and perfrom ADLs without increased neck pain    Baseline pain at times    Time 6    Period Weeks    Status On-going      PT LONG TERM GOAL #2   Title Patient will increase bilateral cervical rotation to 70 degrees without pain    Baseline mild pain at end range      Time 6    Period Weeks    Status Achieved      PT LONG TERM GOAL #3   Title Patient will use her arms for functional tasks without increased pain    Time 6    Period Weeks    Status New    Target Date 11/29/20                 Plan - 10/27/20 1453    Clinical Impression Statement Patient continues to make great progress. She continues to have some spasming in the left upper trap although it has improved significantly. She tolerated ther-ex well. She had a great twtich resposne with needling. Therapy was able to advance her to a blue band. Continues to progress as tolerated.    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    Examination-Activity Limitations Lift;Carry;Stand    Examination-Participation Restrictions Community Activity;Driving;Meal Prep;Laundry    Stability/Clinical Decision Making Evolving/Moderate complexity    Clinical Decision Making Moderate    Rehab Potential Good    PT Frequency 2x / week    PT Duration 6 weeks    PT Treatment/Interventions ADLs/Self Care Home Management;Electrical Stimulation;Ultrasound;Iontophoresis 4mg /ml Dexamethasone;Moist Heat;Neuromuscular re-education;Therapeutic exercise;Therapeutic activities;Functional mobility training;Patient/family education;Manual techniques;Passive range of motion;Dry needling;Taping    PT Next Visit Plan followup about TPDN, manual therapy to cervical spine; cervical stab exercises, STM and myofascial release to R levator scap and upper trap    PT Home Exercise Plan Cervical lateral flexion and rotation bilaterally, cross body stretch. issued red theraband for HEP    Consulted and Agree with Plan of Care Patient           Patient will benefit from skilled therapeutic intervention in order to improve the following deficits and impairments:  Decreased endurance, Pain, Impaired UE functional use, Decreased  range of motion  Visit Diagnosis: Cervicalgia  Abnormal posture  Cramp and spasm     Problem List There are no problems to display for this patient.   Grayling Congress CarrollPT DPT  10/27/2020, 3:19 PM  [facility] [phone]  [place], [place], [address] Phone: [phone]   Fax:  [phone]  Name: MYSTY KIELTY MRN: [id number] Date of Birth: 05-13-1971

## 2020-11-03 ENCOUNTER — Other Ambulatory Visit: Payer: Self-pay

## 2020-11-03 ENCOUNTER — Ambulatory Visit: Payer: Medicaid Other | Admitting: Physical Therapy

## 2020-11-03 DIAGNOSIS — R252 Cramp and spasm: Secondary | ICD-10-CM

## 2020-11-03 DIAGNOSIS — R293 Abnormal posture: Secondary | ICD-10-CM

## 2020-11-03 DIAGNOSIS — M542 Cervicalgia: Secondary | ICD-10-CM

## 2020-11-04 NOTE — Therapy (Addendum)
[place], [place], [address] Phone: [phone]   Fax:  [phone]  Physical Therapy Treatment/Discharge   Patient Details  Name: JUNETTE BERNAT MRN: [id number] Date of Birth: 23-Jul-1971 Referring Provider (PT): Dr Pieter Partridge Dawley    Encounter Date: 11/03/2020   PT End of Session - 11/03/20 1114    Visit Number 8    Number of Visits 12    Date for PT Re-Evaluation 12/13/20    PT Start Time 1100    PT Stop Time 1142    PT Time Calculation (min) 42 min    Activity Tolerance Patient tolerated treatment well    Behavior During Therapy [facility] for tasks assessed/performed           Past Medical History:  Diagnosis Date  . Diabetes mellitus without complication [facility])     Past Surgical History:  Procedure Laterality Date  . GASTRIC BYPASS  2005    There were no vitals filed for this visit.   Subjective Assessment - 11/04/20 1110    Subjective Patient is making continues to make progress. Her upper traps have been looser. She is also having less stressors. She is feeling a catch when she is looking up and to the right. Therapy will address in todays session.    How long can you stand comfortably? limited time standing before she has increased lower back pain    Diagnostic tests MRI done at [facility]    Patient Stated Goals to have less pain and improved movement of the neck    Currently in Pain? No/denies   pain in certain directions                            [facility] Adult PT Treatment/Exercise - 11/04/20 0001      Neck Exercises: Theraband   Shoulder Extension 20 reps;Green    Rows 20 reps;Green    Shoulder External Rotation 20 reps;Green      Neck Exercises: Standing   Other Standing Exercises scap retraction blue 2x10; shoulder extetnsion blue 2x10     Other Standing Exercises Standing Y's 2x10 2lb I's 2x10 2lb       Neck Exercises: Seated   Shoulder Flexion Both;20 reps     Shoulder Flexion Weights (lbs) Red theraband    Other Seated Exercise horizontal abdcution red x10       Manual Therapy   Manual Therapy Soft tissue mobilization;Myofascial release    Manual therapy comments IASTM    Rhomboids, LS, UT, bilaterally   Myofascial Release Myofascial release and STM to R levator scap, upper trap; suboccipital release    Manual Traction Cervical distraction, suboccipital release, CPAs of C3-C7      Neck Exercises: Stretches   Upper Trapezius Stretch 2 reps;Right;30 seconds    Levator Stretch 2 reps;30 seconds    Corner Stretch 2 reps;30 seconds                  PT Education - 11/04/20 1111    Education Details HEP and symptom mangement    Person(s) Educated Patient    Methods Explanation;Demonstration;Tactile cues;Verbal cues    Comprehension Verbalized understanding;Verbal cues required;Tactile cues required;Returned demonstration            PT Short Term Goals - 10/18/20 1507      PT SHORT TERM GOAL #1   Title Patient will increase cervical rotation to the right by 10  degrees    Baseline 72 degrees    Time 3    Period Weeks    Status Achieved      PT SHORT TERM GOAL #2   Title Patient will increase cervical extension bu 5 degrees    Baseline 38 today    Time 3    Period Weeks    Status Achieved    Target Date 09/30/20      PT SHORT TERM GOAL #3   Title Patient will be independent with basic HEP    Baseline perfroming exercises at home    Time 3    Period Weeks    Status Achieved    Target Date 09/30/20             PT Long Term Goals - 10/18/20 1300      PT LONG TERM GOAL #1   Title Patient will stand and perfrom ADLs without increased neck pain    Baseline pain at times    Time 6    Period Weeks    Status On-going      PT LONG TERM GOAL #2   Title Patient will increase bilateral cervical rotation to 70 degrees without pain    Baseline mild pain at end range    Time 6    Period Weeks    Status Achieved        PT LONG TERM GOAL #3   Title Patient will use her arms for functional tasks without increased pain    Time 6    Period Weeks    Status New    Target Date 11/29/20                 Plan - 11/03/20 1136    Clinical Impression Statement Therapy needled the patients cervical paraspinals  areound where she has been having her catch. She reported improved movement with needling and manual therapy. At this time she feels comfortabelw ith her exercises and her stretches. She is certified until [employer]. She will call back if she feels like she needs more visits. She tolerated ther-ex well today. She will continue with her stretches and exercises at home.    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    Examination-Activity Limitations Lift;Carry;Stand    Examination-Participation Restrictions Community Activity;Driving;Meal Prep;Laundry    Stability/Clinical Decision Making Evolving/Moderate complexity    Clinical Decision Making Moderate    Rehab Potential Good    PT Frequency 2x / week    PT Duration 6 weeks    PT Treatment/Interventions ADLs/Self Care Home Management;Electrical Stimulation;Ultrasound;Iontophoresis 40m/ml Dexamethasone;Moist Heat;Neuromuscular re-education;Therapeutic exercise;Therapeutic activities;Functional mobility training;Patient/family education;Manual techniques;Passive range of motion;Dry needling;Taping    PT Next Visit Plan followup about TPDN, manual therapy to cervical spine; cervical stab exercises, STM and myofascial release to R levator scap and upper trap    PT Home Exercise Plan Cervical lateral flexion and rotation bilaterally, cross body stretch. issued red theraband for HEP    Consulted and Agree with Plan of Care Patient           Patient will benefit from skilled therapeutic intervention in order to improve the following deficits and  impairments:  Decreased endurance, Pain, Impaired UE functional use, Decreased range of motion  Visit Diagnosis: Cervicalgia  Abnormal posture  Cramp and spasm     Problem List There are no problems to display for this patient.   DCarney LivingPT DPT  11/04/2020, 11:12 AM   PHYSICAL THERAPY DISCHARGE SUMMARY  Visits from Start of Care: 8  Current functional level related to goals / functional outcomes: Significant improvement in cervical rotation and UE function    Remaining deficits: Pain at times but able to mange    Education / Equipment: HEP  Plan: Patient agrees to discharge.  Patient goals were not met.                                                  meeting the stated rehab goals.  ?????      [place], [place], [address] Phone: [phone]   Fax:  [phone]  Name: LINDEN MIKES MRN: [id number] Date of Birth: 1971-04-22

## 2020-11-23 ENCOUNTER — Encounter: Payer: Self-pay | Admitting: Gastroenterology

## 2021-01-04 ENCOUNTER — Ambulatory Visit: Payer: Medicaid Other | Admitting: Gastroenterology

## 2021-01-05 ENCOUNTER — Other Ambulatory Visit: Payer: Self-pay

## 2021-01-05 ENCOUNTER — Ambulatory Visit: Payer: Medicaid Other | Admitting: Neurology

## 2021-01-05 ENCOUNTER — Encounter: Payer: Self-pay | Admitting: Neurology

## 2021-01-05 ENCOUNTER — Telehealth: Payer: Self-pay | Admitting: Neurology

## 2021-01-05 VITALS — BP 132/83 | HR 68 | Ht 63.0 in | Wt 230.0 lb

## 2021-01-05 DIAGNOSIS — Z789 Other specified health status: Secondary | ICD-10-CM

## 2021-01-05 DIAGNOSIS — R351 Nocturia: Secondary | ICD-10-CM

## 2021-01-05 DIAGNOSIS — G44209 Tension-type headache, unspecified, not intractable: Secondary | ICD-10-CM

## 2021-01-05 DIAGNOSIS — F439 Reaction to severe stress, unspecified: Secondary | ICD-10-CM

## 2021-01-05 DIAGNOSIS — R519 Headache, unspecified: Secondary | ICD-10-CM

## 2021-01-05 DIAGNOSIS — Z8669 Personal history of other diseases of the nervous system and sense organs: Secondary | ICD-10-CM

## 2021-01-05 NOTE — Patient Instructions (Addendum)
It was nice to meet you today.  Your headaches do not sound like typical migraine headaches.  It is possible that your headaches are from a combination of things:  Strain of the eyes can cause recurrent headaches.  I would recommend that you get your new eyeglasses and start using them soon as possible.  Stress may be causing tension headaches.  We will do a sleep study to investigate your sleep apnea.  It is possible that underlying obstructive sleep apnea is causing you headaches on a recurrent basis.  If you have obstructive sleep apnea, I will likely recommend a machine called CPAP or AutoPap.  In addition, please try to reduce your caffeine intake as excessive caffeine can be a contributor to recurrent headaches.  Please try to limit yourself to 1 or 2 servings of caffeine eventually.  Please try to get enough sleep, 7 to 8 hours of sleep are recommended, generally speaking.  For now, you can try Tylenol alternating with Advil or Motrin for your headaches.  Try to avoid over-the-counter medication on a daily basis, use only if needed.  We will also proceed with a brain MRI with and without contrast to rule out a structural cause of your new onset headaches.  In preparation for your brain MRI, would like for you to have a chemistry panel today which is one blood test to make sure your kidney and liver function are okay.  We will keep you posted as to your test results and plan a follow-up after testing.

## 2021-01-05 NOTE — Telephone Encounter (Signed)
mcd healthy blue pending per insurance can take up to 15 days

## 2021-01-05 NOTE — Progress Notes (Signed)
Subjective:    Patient ID: Mackenzie Castillo is a 50 y.o. female.  HPI     Star Age, MD, PhD [facility] [address], [address] [place], [place] [address]  Dear Dr. Marin Comment,    I saw your patient, Mackenzie Castillo, upon your kind request in my neurologic clinic today for initial consultation of her recurrent headaches.  The patient is unaccompanied today.  As you know, Mackenzie Castillo is a 50 year old right-handed woman with an underlying medical history of chronic neck pain, diabetes, depression, smoking, obesity with status post gastric bypass in 2005, who reports new onset headaches for the past 8 days, started last week.  She reports a constant aching in the bifrontal area, maybe a little more on the left side.  She has had light sensitivity.  She denies any nausea or vomiting or throbbing headache or one-sided headache or sudden onset of one-sided weakness or numbness or tingling or droopy face or slurring of speech, no prior history of migraines.  She does not typically have nocturnal or morning headaches.  She has not tried anything for the headaches she stated initially but then reported taking ibuprofen as needed, up to 800 mg twice daily.  She has not talked to her primary care physician about the headaches.  She does drink quite a bit of caffeine in the form of coffee, about 2 cups/day and energy drinks about 3/day.  She does not drink any alcohol.  She smokes about a pack per day.  She wants to quit smoking.  In fact, until 2013 she had not smoked for about 10 years but resumed smoking after she lost her dad.  She does not have a family history of migraines.  She has never had a brain scan.  She had a change in her eyeglass prescription but has not picked up her new eyeglasses yet. I reviewed your office note from 01/03/2021, patient had best corrected visual acuity of 20/20 bilaterally.  Extraocular muscles were full and unrestricted.  Confrontation fields  were full in all fields of gaze.  Dilated fundus exam revealed healthy, pink, and distinct disc.  Peripheral retina did not show any signs of retinal holes, tears, breaks or detachment. She has a prior diagnosis of obstructive sleep apnea, sleep study testing was over 15 years ago.  She had a CPAP machine but no longer uses it.  She lost quite a bit of weight after her weight loss surgery, maximum weight before surgery was 342 pounds.  She is currently not working.  She lives with her 2 children, son is 35 and her daughter is 52.  She reports that her daughter has special needs.  She also reports being separated from her husband.  She goes to bed before midnight and rise time is generally between 6 and 7.  She has nocturia about once per night on average.  Her Past Medical History Is Significant For: Past Medical History:  Diagnosis Date  . Diabetes mellitus without complication ([place])     Her Past Surgical History Is Significant For: Past Surgical History:  Procedure Laterality Date  . GASTRIC BYPASS  2005    Her Family History Is Significant For: Family History  Family history unknown: Yes    Her Social History Is Significant For: Social History   Socioeconomic History  . Marital status: Married    Spouse name: Not on file  . Number of children: Not on file  . Years of education: Not on  file  . Highest education level: Not on file  Occupational History  . Not on file  Tobacco Use  . Smoking status: Heavy Tobacco Smoker    Packs/day: 1.00    Types: Cigarettes  . Smokeless tobacco: Never Used  Substance and Sexual Activity  . Alcohol use: Yes    Comment: socially  . Drug use: Yes    Types: Marijuana  . Sexual activity: Not on file  Other Topics Concern  . Not on file  Social History Narrative  . Not on file   Social Determinants of Health   Financial Resource Strain: Not on file  Food Insecurity: Not on file  Transportation Needs: Not on file  Physical Activity:  Not on file  Stress: Not on file  Social Connections: Not on file    Her Allergies Are:  No Known Allergies:   Her Current Medications Are:  Outpatient Encounter Medications as of 01/05/2021  Medication Sig  . cetirizine (ZYRTEC) 10 MG tablet Take 10 mg by mouth daily.  . citalopram (CELEXA) 40 MG tablet Take 40 mg by mouth at bedtime.  . metFORMIN (GLUCOPHAGE) 500 MG tablet Take 1 tablet by mouth 2 (two) times daily with a meal.  . traZODone (DESYREL) 50 MG tablet Take 50 mg by mouth at bedtime.  . [DISCONTINUED] gabapentin (NEURONTIN) 300 MG capsule Take 300 mg by mouth 2 (two) times daily.  . [DISCONTINUED] methocarbamol (ROBAXIN) 500 MG tablet Take 500 mg by mouth 2 (two) times daily.  . [DISCONTINUED] predniSONE (DELTASONE) 50 MG tablet Take 1 tablet (50 mg total) by mouth daily with breakfast. (Patient not taking: Reported on 09/08/2020)  . [DISCONTINUED] tiZANidine (ZANAFLEX) 4 MG tablet Take 1 tablet (4 mg total) by mouth every 8 (eight) hours as needed for muscle spasms.   No facility-administered encounter medications on file as of 01/05/2021.  :  Review of Systems:  Out of a complete 14 point review of systems, all are reviewed and negative with the exception of these symptoms as listed below: Review of Systems  Neurological:       Patient presents today to discuss her headaches.  Since last Tuesday patient has been sensitive to light which causes headaches.  Patient has had a sleep study in the past and placed on CPAP but she currently does not use it.  She is unsure if she snores.  Epworth Sleepiness Scale 0= would never doze 1= slight chance of dozing 2= moderate chance of dozing 3= high chance of dozing  Sitting and reading: 1 Watching TV: 2 Sitting inactive in a public place (ex. Theater or meeting): 1 As a passenger in a car for an hour without a break: 1 Lying down to rest in the afternoon: 3 Sitting and talking to someone: 0 Sitting quietly after lunch (no  alcohol): 0 In a car, while stopped in traffic: 0 Total: 8     Objective:  Neurological Exam  Physical Exam Physical Examination:   Vitals:   01/05/21 0810  BP: 132/83  Pulse: 68   General Examination: The patient is a very pleasant 50 y.o. female in no acute distress. She appears well-developed and well-nourished and well groomed.   HEENT: Normocephalic, atraumatic, pupils are equal, round and reactive to light and accommodation.  He does not have any significant photophobia at this time.  She is tolerant of the eye examination without problems. Funduscopic exam is normal with sharp disc margins noted. Extraocular tracking is good without limitation to gaze excursion or  nystagmus noted. Normal smooth pursuit is noted. Hearing is grossly intact. Face is symmetric with normal facial animation. Speech is clear with no dysarthria noted. There is no hypophonia. There is no lip, neck/head, jaw or voice tremor. Neck is supple with full range of passive and active motion. There are no carotid bruits on auscultation. Oropharynx exam reveals: moderate mouth dryness, adequate dental hygiene and moderate airway crowding, due to small airway entry, tonsillar size of about 1-2+, wider uvula noted.  Neck circumference of 14 inches.  Mallampati class II.  Tongue protrudes centrally and palate elevates symmetrically.  Chest: Clear to auscultation without wheezing, rhonchi or crackles noted.  Heart: S1+S2+0, regular and normal without murmurs, rubs or gallops noted.   Abdomen: Soft, non-tender and non-distended with normal bowel sounds appreciated on auscultation.  Extremities: There is no pitting edema in the distal lower extremities bilaterally.  Skin: Warm and dry without trophic changes noted.  Musculoskeletal: exam reveals no obvious joint deformities, tenderness or joint swelling or erythema.   Neurologically:  Mental status: The patient is awake, alert and oriented in all 4 spheres. Her  immediate and remote memory, attention, language skills and fund of knowledge are appropriate. There is no evidence of aphasia, agnosia, apraxia or anomia. Speech is clear with normal prosody and enunciation. Thought process is linear. Mood is normal and affect is normal.  Cranial nerves II - XII are as described above under HEENT exam. In addition: shoulder shrug is normal with equal shoulder height noted. Motor exam: Normal bulk, strength and tone is noted. There is no drift, tremor or rebound. Romberg is negative. Reflexes are 2+ throughout. Babinski: Toes are flexor bilaterally. Fine motor skills and coordination: intact with normal finger taps, normal hand movements, normal rapid alternating patting, normal foot taps and normal foot agility.  Cerebellar testing: No dysmetria or intention tremor on finger to nose testing. Heel to shin is unremarkable bilaterally. There is no truncal or gait ataxia.  Sensory exam: intact to light touch, vibration, temperature sense in the upper and lower extremities.  Gait, station and balance: She stands easily. No veering to one side is noted. No leaning to one side is noted. Posture is age-appropriate and stance is narrow based. Gait shows normal stride length and normal pace. No problems turning are noted. Tandem walk is challenging for her but doable.  Assessment and Plan:   In summary, Mackenzie Castillo is a very pleasant 50 y.o.-year old female  with an underlying medical history of chronic neck pain, diabetes, depression, smoking, obesity with status post gastric bypass in 2005, who presents for evaluation of her new onset recurrent headaches.  She reports a fairly constant headache for the past 8 days, it is in the bifrontal area, little more so on the left side, no associated nausea or vomiting that she feels sensitive to light.  She endorses stress, prior diagnosis of sleep apnea, having a new eyeglass prescription and drinks quite a bit of caffeine I think  all of these are contributors to her headaches including tension headache, straining her eyes, stress, excessive caffeine intake.  She has tried ibuprofen over-the-counter.  While she is discouraged from using over-the-counter pain medication on a daily basis to prevent overuse headaches, she is advised to try Tylenol as needed and alternate with Advil as needed.  She is furthermore advised to cut back on her caffeine intake gradually to try to limit herself to 1-2 servings per day eventually.  We talked about the importance of  smoking cessation.  Her neurological exam is nonfocal thankfully.  She is nevertheless advised to proceed with a brain MRI with and without contrast to rule out a structural cause of her headaches.  Headaches do not sound migrainous and she does not have a prior history of migraine-like headaches.  Underlying obstructive sleep apnea is a possibility.  She is advised to proceed with a sleep study and consider CPAP or AutoPap therapy should her sleep study revealed obstructive sleep apnea.  We talked about headache triggers and the importance of good hydration, getting enough rest, in particular, she is advised to try to strive for 7 to 8 hours of sleep a night.  We will keep her posted as to her test results and plan to follow-up afterwards.  She is encouraged to pick up her new prescription eyeglasses as soon as possible.   I answered all her questions today and she was in agreement with the plan.  In preparation for her brain MRI with and without contrast we will do a chemistry panel today.   Thank you very much for allowing me to participate in the care of this nice patient. If I can be of any further assistance to you please do not hesitate to call me at [phone].  Sincerely,   Star Age, MD, PhD

## 2021-01-06 LAB — COMPREHENSIVE METABOLIC PANEL
ALT: 20 IU/L (ref 0–32)
AST: 23 IU/L (ref 0–40)
Albumin/Globulin Ratio: 1.6 (ref 1.2–2.2)
Albumin: 4.2 g/dL (ref 3.8–4.8)
Alkaline Phosphatase: 116 IU/L (ref 44–121)
BUN/Creatinine Ratio: 13 (ref 9–23)
BUN: 9 mg/dL (ref 6–24)
Bilirubin Total: <0.2 mg/dL (ref 0.0–1.2)
CO2: 26 mmol/L (ref 20–29)
Calcium: 9.8 mg/dL (ref 8.7–10.2)
Chloride: 103 mmol/L (ref 96–106)
Creatinine, Ser: 0.68 mg/dL (ref 0.57–1.00)
GFR calc Af Amer: 119 mL/min/{1.73_m2} (ref >59)
GFR calc non Af Amer: 103 mL/min/{1.73_m2} (ref >59)
Globulin, Total: 2.7 g/dL (ref 1.5–4.5)
Glucose: 118 mg/dL — ABNORMAL HIGH (ref 65–99)
Potassium: 4.5 mmol/L (ref 3.5–5.2)
Sodium: 143 mmol/L (ref 134–144)
Total Protein: 6.9 g/dL (ref 6.0–8.5)

## 2021-01-12 NOTE — Telephone Encounter (Signed)
I checked the status on the portal and it is still pending.  

## 2021-01-17 NOTE — Telephone Encounter (Signed)
I checked the status on the portal it is still pending.  

## 2021-01-19 NOTE — Telephone Encounter (Signed)
I called pt. No answer, left a message asking pt to call me back.   

## 2021-01-19 NOTE — Telephone Encounter (Signed)
Medicaid healthy blue did not approve the MRI.  "Here are the policy requirements your request did not meet: We know that you have had headaches. Your doctor wanted a picture of his head. This test should be used when your doctor is looking for certain conditions. This test should be used when the headaches are getting stronger or more frequent despite the medication that your doctor has given. You. For this reason, we cannot approve the test. "  There is an option to do a peer to peer. The phone number for the peer to peer is [phone]. Ext. [phone]. You just call and leave a voicemail on the best number to contact you and the best time to contact you and they will call you back to set up the peer to peer. Iana stated to schedule within 2 days.

## 2021-01-19 NOTE — Telephone Encounter (Signed)
Please call patient and advise her that her insurance did not approve the MRI of the brain. For now, we will follow her clinically. We can revisit the need for an MRI in the near future. We will proceed with the sleep test. In addition, please remind her of the lifestyle modifications we talked about including reduction of caffeine intake, staying well-hydrated and well rested. We will plan to follow-up after her sleep test. Please inquire if she has been scheduled for her sleep study yet.

## 2021-02-22 ENCOUNTER — Telehealth: Payer: Self-pay

## 2021-02-22 NOTE — Telephone Encounter (Signed)
LVM for pt to call me back to schedule sleep study  

## 2021-03-14 ENCOUNTER — Ambulatory Visit (INDEPENDENT_AMBULATORY_CARE_PROVIDER_SITE_OTHER): Payer: Medicaid Other | Admitting: Neurology

## 2021-03-14 DIAGNOSIS — G4733 Obstructive sleep apnea (adult) (pediatric): Secondary | ICD-10-CM | POA: Diagnosis not present

## 2021-03-14 DIAGNOSIS — Z8669 Personal history of other diseases of the nervous system and sense organs: Secondary | ICD-10-CM

## 2021-03-14 DIAGNOSIS — R351 Nocturia: Secondary | ICD-10-CM

## 2021-03-14 DIAGNOSIS — G44209 Tension-type headache, unspecified, not intractable: Secondary | ICD-10-CM

## 2021-03-14 DIAGNOSIS — F439 Reaction to severe stress, unspecified: Secondary | ICD-10-CM

## 2021-03-14 DIAGNOSIS — Z789 Other specified health status: Secondary | ICD-10-CM

## 2021-03-14 DIAGNOSIS — R519 Headache, unspecified: Secondary | ICD-10-CM

## 2021-03-15 NOTE — Progress Notes (Signed)
° °

## 2021-03-16 NOTE — Addendum Note (Signed)
Addended by: Star Age on: 03/16/2021 06:00 PM   Modules accepted: Orders

## 2021-03-16 NOTE — Procedures (Signed)
   [facility] TEST (Watch PAT)  STUDY DATE: 03/14/21  DOB: 01-31-1971  MRN: [id number]  ORDERING CLINICIAN: Star Age, MD, PhD   REFERRING CLINICIAN: Dr. Marin Comment   CLINICAL INFORMATION/HISTORY: 50 year old woman with a history of chronic neck pain, diabetes, depression, smoking, obesity with status post gastric bypass in 2005, who reports new onset headaches.  Epworth sleepiness score: 8/24.  BMI: 40.6 kg/m  Neck Circumference:  14 "  FINDINGS:   Total Record Time (hours, min): 7 H 5 min  Total Sleep Time (hours, min):  5 H 35 min  Percent REM (%):    24.35 %   Calculated pAHI (per hour): 7.4       REM pAHI: 10.3    NREM pAHI: 6.5 Supine AHI: 9.1   Oxygen Saturation (%) Mean: 92  Minimum oxygen saturation (%):         87   O2 Saturation Range (%): 87-96  O2Saturation (minutes) <=88%: 0.2 min   Pulse Mean (bpm):    65  Pulse Range (54-80)   IMPRESSION: OSA (obstructive sleep apnea), mild  RECOMMENDATION:  This home sleep test demonstrates overall mild obstructive sleep apnea with a total AHI of 7.4/hour and O2 nadir of 87%. Given the patient's medical history and sleep related complaints, treatment with positive airway pressure is recommended. This can be achieved in the form of autoPAP trial/titration at home. A full night CPAP titration study will help with proper treatment settings and mask fitting if needed. Alternative treatments include weight loss along with avoidance of the supine sleep position, or an oral appliance in appropriate candidates. Please note that untreated obstructive sleep apnea may carry additional perioperative morbidity. Patients with significant obstructive sleep apnea should receive perioperative PAP therapy and the surgeons and particularly the anesthesiologist should be informed of the diagnosis and the severity of the sleep disordered breathing. The patient should be cautioned not to drive, work at heights, or operate  dangerous or heavy equipment when tired or sleepy. Review and reiteration of good sleep hygiene measures should be pursued with any patient. Other causes of the patient's symptoms, including circadian rhythm disturbances, an underlying mood disorder, medication effect and/or an underlying medical problem cannot be ruled out based on this test. Clinical correlation is recommended.  The patient and his referring provider will be notified of the test results. The patient will be seen in follow up in sleep clinic at [facility].   I certify that I have reviewed the raw data recording prior to the issuance of this report in accordance with the standards of the American Academy of Sleep Medicine (AASM).  INTERPRETING PHYSICIAN:  Star Age, MD, PhD   Board Certified in Neurology and Sleep Medicine [facility] [address] [place], [place] [address] [phone]

## 2021-03-16 NOTE — Progress Notes (Signed)
Patient referred by Dr. Marin Comment for HA eval, seen by me on 01/05/21, HST on 03/14/21.    Please call and notify the patient that the recent home sleep test showed obstructive sleep apnea. OSA is overall mild, but worth treating to see if she feels better after treatment. Of note, she had CPAP in the past, prior to her wt loss surgery.  I recommend treatment in the form of autoPAP, which means, that we don't have to bring her in for a sleep study with CPAP, but will let her try an autoPAP machine at home, through a DME company (of her choice, or as per insurance requirement). The DME representative will educate her on how to use the machine, how to put the mask on, etc. I have placed an order in the chart. Please send referral, talk to patient, send report to referring MD. We will need a FU in sleep clinic for 10 weeks post-PAP set up, please arrange that with me or one of our NPs. Thanks,   Star Age, MD, PhD [facility])

## 2021-03-17 ENCOUNTER — Telehealth: Payer: Self-pay

## 2021-03-17 DIAGNOSIS — G4733 Obstructive sleep apnea (adult) (pediatric): Secondary | ICD-10-CM

## 2021-03-17 NOTE — Telephone Encounter (Signed)
-----   Message from Star Age, MD sent at 03/16/2021  6:00 PM EDT ----- Patient referred by Dr. Marin Comment for HA eval, seen by me on 01/05/21, HST on 03/14/21.    Please call and notify the patient that the recent home sleep test showed obstructive sleep apnea. OSA is overall mild, but worth treating to see if she feels better after treatment. Of note, she had CPAP in the past, prior to her wt loss surgery.  I recommend treatment in the form of autoPAP, which means, that we don't have to bring her in for a sleep study with CPAP, but will let her try an autoPAP machine at home, through a DME company (of her choice, or as per insurance requirement). The DME representative will educate her on how to use the machine, how to put the mask on, etc. I have placed an order in the chart. Please send referral, talk to patient, send report to referring MD. We will need a FU in sleep clinic for 10 weeks post-PAP set up, please arrange that with me or one of our NPs. Thanks,   Star Age, MD, PhD [facility])

## 2021-03-17 NOTE — Telephone Encounter (Addendum)
I called the pt and we had an extensive conversation. Pt declined starting autopap at this time but would like to pursue the dental device option.  Referral has been sent to Dr. Ron Parker. Pt advised if she would like to pursue auto to let us know, clinicals/testing are current for 6 months per her insurance policy.

## 2021-03-17 NOTE — Telephone Encounter (Signed)
I called pt. No answer, left a message asking pt to call me back.   

## 2021-03-17 NOTE — Telephone Encounter (Signed)
Pt returned call. Please call back when available. 

## 2021-03-17 NOTE — Addendum Note (Signed)
Addended by: Verlin Grills on: 03/17/2021 03:11 PM   Modules accepted: Orders

## 2021-04-14 NOTE — Telephone Encounter (Signed)
I called the pt and we discussed this message.  Pt's insurance would not approve oral appliance and pt is still hesitant to start autopap. Pt has been scheduled for f/u appt in June with Dr. Rexene Alberts for re-eval of her headaches and we can further discuss the idea of auto-pap therapy.

## 2021-04-14 NOTE — Telephone Encounter (Signed)
Mackenzie Castillo from Dr. Ron Parker office called wanting to inform the provider that the pt was informed that her Medicaid will not cover the dental device and the pt did not want to schedule an appt with Dr. Ron Parker office. Please advise.

## 2021-05-04 ENCOUNTER — Other Ambulatory Visit (HOSPITAL_COMMUNITY)
Admission: RE | Admit: 2021-05-04 | Discharge: 2021-05-04 | Disposition: A | Discharge status: Home / Self Care | Payer: Medicaid Other | Source: Ambulatory Visit | Attending: Oral Surgery | Admitting: Oral Surgery

## 2021-05-04 DIAGNOSIS — Z01812 Encounter for preprocedural laboratory examination: Secondary | ICD-10-CM | POA: Diagnosis present

## 2021-05-04 DIAGNOSIS — Z20822 Contact with and (suspected) exposure to covid-19: Secondary | ICD-10-CM | POA: Diagnosis not present

## 2021-05-04 LAB — SARS CORONAVIRUS 2 (TAT 6-24 HRS): SARS Coronavirus 2: NEGATIVE

## 2021-05-04 NOTE — H&P (Signed)
HISTORY AND PHYSICAL  Mackenzie Castillo is a 50 y.o. female patient with CC: Painful teeth.  No diagnosis found.  Past Medical History:  Diagnosis Date  . Diabetes mellitus without complication ([place])     No current facility-administered medications for this encounter.   Current Outpatient Medications  Medication Sig Dispense Refill  . cetirizine (ZYRTEC) 10 MG tablet Take 10 mg by mouth daily.    . citalopram (CELEXA) 40 MG tablet Take 40 mg by mouth at bedtime.    . Levomefolate Glucosamine (METHYLFOLATE PO) Take 1 capsule by mouth daily.    Marland Kitchen PROAIR HFA 108 (90 Base) MCG/ACT inhaler Inhale 1 puff into the lungs every 4 (four) hours as needed for wheezing or shortness of breath.    . traZODone (DESYREL) 100 MG tablet Take 100 mg by mouth at bedtime.     No Known Allergies Active Problems:   * No active hospital problems. *  Vitals: There were no vitals taken for this visit. Lab results:No results found for this or any previous visit (from the past 73 hour(s)). Radiology Results: No results found. General appearance: alert, cooperative, no distress and moderately obese Head: Normocephalic, without obvious abnormality, atraumatic Eyes: negative Nose: Nares normal. Septum midline. Mucosa normal. No drainage or sinus tenderness. Throat: Caries teeth # 2, 5, 15, 29, 30. Decay #11 incisal edge. No purulence, edema, fluctuance, trismus. Oral cancer screening negative. Pharynx clear. Mallampati 1. No lymphadenopathy. Neck: no adenopathy and supple, symmetrical, trachea midline Resp: wheezes LLL and LUL Cardio: regular rate and rhythm, S1, S2 normal, no murmur, click, rub or gallop  Panorex: Caries teeth #2, 5, 11, 15, 29, 30. PARL # 29 and 30.  Assessment:  ASA  3. Non-restorable teeth #2, 5, 15, 29, 30. Restorable #11.              Plan: 1. MD Clearance  2. Extraction Teeth # 2, 5, 15, 29, 30 GA, Day surgery    Diona Browner 05/04/2021

## 2021-05-05 ENCOUNTER — Other Ambulatory Visit: Payer: Self-pay

## 2021-05-05 ENCOUNTER — Encounter (HOSPITAL_COMMUNITY): Payer: Self-pay | Admitting: Oral Surgery

## 2021-05-05 NOTE — Anesthesia Preprocedure Evaluation (Addendum)
Anesthesia Evaluation  Patient identified by MRN, date of birth, ID band Patient awake    Reviewed: Allergy & Precautions, NPO status , Patient's Chart, lab work & pertinent test results  History of Anesthesia Complications Negative for: history of anesthetic complications  Airway Mallampati: I  TM Distance: >3 FB Neck ROM: Full    Dental  (+) Poor Dentition, Dental Advisory Given, Chipped, Missing   Pulmonary COPD,  COPD inhaler, Current SmokerPatient did not abstain from smoking.,  05/04/2021 SARS coronavirus NEG   breath sounds clear to auscultation       Cardiovascular negative cardio ROS   Rhythm:Regular Rate:Normal     Neuro/Psych  Headaches, Depression    GI/Hepatic Neg liver ROS, GERD  Controlled,S/p gastric bypass   Endo/Other  diabetes (glu 187), Oral Hypoglycemic AgentsMorbid obesity  Renal/GU negative Renal ROS     Musculoskeletal  (+) Fibromyalgia -  Abdominal (+) + obese,   Peds  Hematology negative hematology ROS (+)   Anesthesia Other Findings   Reproductive/Obstetrics                            Anesthesia Physical Anesthesia Plan  ASA: III  Anesthesia Plan: General   Post-op Pain Management:    Induction: Intravenous  PONV Risk Score and Plan: Ondansetron and Dexamethasone  Airway Management Planned: Oral ETT, Nasal ETT and Video Laryngoscope Planned  Additional Equipment: None  Intra-op Plan:   Post-operative Plan: Extubation in OR  Informed Consent: I have reviewed the patients History and Physical, chart, labs and discussed the procedure including the risks, benefits and alternatives for the proposed anesthesia with the patient or authorized representative who has indicated his/her understanding and acceptance.     Dental advisory given  Plan Discussed with: CRNA and Surgeon  Anesthesia Plan Comments:        Anesthesia Quick Evaluation

## 2021-05-05 NOTE — Progress Notes (Addendum)
I spoke with Kenney Houseman at [place] office and asked if the office has any health history on patient. Wonda Cerise will ask the surgical coordinator.  I received records, Ms. Hathaway has not had an EKG, patient will have one on arrival.  Mrs Tout denies chest pain or shortness of breath. Patient was tested for Covid and has been in quarantine since that time.  Mrs. Higby has type II diabetes, patient has been prescribed  Metformin, she has stopped it on her own and has changed her diet. Patient does not check CBG or have a machine.

## 2021-05-06 ENCOUNTER — Ambulatory Visit (HOSPITAL_COMMUNITY): Payer: Medicaid Other | Admitting: Anesthesiology

## 2021-05-06 ENCOUNTER — Encounter (HOSPITAL_COMMUNITY): Payer: Self-pay | Admitting: Oral Surgery

## 2021-05-06 ENCOUNTER — Encounter (HOSPITAL_COMMUNITY)
Admission: RE | Disposition: A | Discharge status: Home / Self Care | Payer: Self-pay | Source: Home / Self Care | Attending: Oral Surgery

## 2021-05-06 ENCOUNTER — Other Ambulatory Visit: Payer: Self-pay

## 2021-05-06 ENCOUNTER — Ambulatory Visit (HOSPITAL_COMMUNITY)
Admission: RE | Admit: 2021-05-06 | Discharge: 2021-05-06 | Disposition: A | Discharge status: Home / Self Care | Payer: Medicaid Other | Attending: Oral Surgery | Admitting: Oral Surgery

## 2021-05-06 DIAGNOSIS — K0889 Other specified disorders of teeth and supporting structures: Secondary | ICD-10-CM | POA: Diagnosis not present

## 2021-05-06 DIAGNOSIS — Z79899 Other long term (current) drug therapy: Secondary | ICD-10-CM | POA: Insufficient documentation

## 2021-05-06 HISTORY — DX: Depression, unspecified: F32.A

## 2021-05-06 HISTORY — DX: Leiomyoma of uterus, unspecified: D25.9

## 2021-05-06 HISTORY — DX: Constipation, unspecified: K59.00

## 2021-05-06 HISTORY — DX: Fibromyalgia: M79.7

## 2021-05-06 HISTORY — DX: Headache, unspecified: R51.9

## 2021-05-06 HISTORY — DX: Anemia, unspecified: D64.9

## 2021-05-06 HISTORY — PX: TOOTH EXTRACTION: SHX859

## 2021-05-06 HISTORY — DX: Unspecified asthma, uncomplicated: J45.909

## 2021-05-06 LAB — BASIC METABOLIC PANEL
Anion gap: 10 (ref 5–15)
BUN: 12 mg/dL (ref 6–20)
CO2: 26 mmol/L (ref 22–32)
Calcium: 9 mg/dL (ref 8.9–10.3)
Chloride: 100 mmol/L (ref 98–111)
Creatinine, Ser: 0.7 mg/dL (ref 0.44–1.00)
GFR, Estimated: >60 mL/min (ref >60)
Glucose, Bld: 156 mg/dL — ABNORMAL HIGH (ref 70–99)
Potassium: 3.7 mmol/L (ref 3.5–5.1)
Sodium: 136 mmol/L (ref 135–145)

## 2021-05-06 LAB — GLUCOSE, CAPILLARY
Glucose-Capillary: 187 mg/dL — ABNORMAL HIGH (ref 70–99)
Glucose-Capillary: 188 mg/dL — ABNORMAL HIGH (ref 70–99)

## 2021-05-06 SURGERY — DENTAL RESTORATION/EXTRACTIONS
Anesthesia: General | Site: Mouth

## 2021-05-06 MED ORDER — FENTANYL CITRATE (PF) 250 MCG/5ML IJ SOLN
INTRAMUSCULAR | Status: AC
  Filled 2021-05-06: qty 5

## 2021-05-06 MED ORDER — LIDOCAINE 2% (20 MG/ML) 5 ML SYRINGE
INTRAMUSCULAR | Status: AC
  Filled 2021-05-06: qty 5

## 2021-05-06 MED ORDER — SUGAMMADEX SODIUM 200 MG/2ML IV SOLN
INTRAVENOUS | Status: DC | PRN
  Administered 2021-05-06: 400 mg via INTRAVENOUS

## 2021-05-06 MED ORDER — DEXAMETHASONE SODIUM PHOSPHATE 10 MG/ML IJ SOLN
INTRAMUSCULAR | Status: DC | PRN
  Administered 2021-05-06: 10 mg via INTRAVENOUS

## 2021-05-06 MED ORDER — ONDANSETRON HCL 4 MG/2ML IJ SOLN
INTRAMUSCULAR | Status: DC | PRN
  Administered 2021-05-06: 4 mg via INTRAVENOUS

## 2021-05-06 MED ORDER — LIDOCAINE 2% (20 MG/ML) 5 ML SYRINGE
INTRAMUSCULAR | Status: DC | PRN
  Administered 2021-05-06: 20 mg via INTRAVENOUS

## 2021-05-06 MED ORDER — LIDOCAINE-EPINEPHRINE 2 %-1:100000 IJ SOLN
INTRAMUSCULAR | Status: AC
  Filled 2021-05-06: qty 1

## 2021-05-06 MED ORDER — ALBUTEROL SULFATE HFA 108 (90 BASE) MCG/ACT IN AERS
INHALATION_SPRAY | RESPIRATORY_TRACT | Status: AC
  Filled 2021-05-06: qty 6.7

## 2021-05-06 MED ORDER — PHENYLEPHRINE 40 MCG/ML (10ML) SYRINGE FOR IV PUSH (FOR BLOOD PRESSURE SUPPORT)
PREFILLED_SYRINGE | INTRAVENOUS | Status: DC | PRN
  Administered 2021-05-06: 80 ug via INTRAVENOUS

## 2021-05-06 MED ORDER — MIDAZOLAM HCL 2 MG/2ML IJ SOLN
INTRAMUSCULAR | Status: DC | PRN
  Administered 2021-05-06: 2 mg via INTRAVENOUS

## 2021-05-06 MED ORDER — PHENYLEPHRINE 40 MCG/ML (10ML) SYRINGE FOR IV PUSH (FOR BLOOD PRESSURE SUPPORT)
PREFILLED_SYRINGE | INTRAVENOUS | Status: AC
  Filled 2021-05-06: qty 10

## 2021-05-06 MED ORDER — CHLORHEXIDINE GLUCONATE 0.12 % MT SOLN
15.0000 mL | Freq: Once | OROMUCOSAL | Status: AC
  Administered 2021-05-06: 15 mL via OROMUCOSAL
  Filled 2021-05-06: qty 15

## 2021-05-06 MED ORDER — ROCURONIUM BROMIDE 10 MG/ML (PF) SYRINGE
PREFILLED_SYRINGE | INTRAVENOUS | Status: AC
  Filled 2021-05-06: qty 10

## 2021-05-06 MED ORDER — ORAL CARE MOUTH RINSE: 15.0000 mL | Freq: Once | OROMUCOSAL | Status: AC

## 2021-05-06 MED ORDER — PROPOFOL 10 MG/ML IV BOLUS
INTRAVENOUS | Status: DC | PRN
  Administered 2021-05-06: 120 mg via INTRAVENOUS

## 2021-05-06 MED ORDER — ONDANSETRON HCL 4 MG/2ML IJ SOLN
INTRAMUSCULAR | Status: AC
  Filled 2021-05-06: qty 2

## 2021-05-06 MED ORDER — 0.9 % SODIUM CHLORIDE (POUR BTL) OPTIME
TOPICAL | Status: DC | PRN
  Administered 2021-05-06: 1000 mL

## 2021-05-06 MED ORDER — MIDAZOLAM HCL 2 MG/2ML IJ SOLN
INTRAMUSCULAR | Status: AC
  Filled 2021-05-06: qty 2

## 2021-05-06 MED ORDER — HYDROMORPHONE HCL 1 MG/ML IJ SOLN: 0.2500 mg | INTRAMUSCULAR | Status: DC | PRN

## 2021-05-06 MED ORDER — HYDROCODONE-ACETAMINOPHEN 5-325 MG PO TABS: 1.0000 | ORAL_TABLET | Freq: Four times a day (QID) | ORAL | 0 refills | Status: DC | PRN

## 2021-05-06 MED ORDER — PROMETHAZINE HCL 25 MG/ML IJ SOLN: 6.2500 mg | INTRAMUSCULAR | Status: DC | PRN

## 2021-05-06 MED ORDER — OXYMETAZOLINE HCL 0.05 % NA SOLN
NASAL | Status: AC
  Filled 2021-05-06: qty 30

## 2021-05-06 MED ORDER — CEFAZOLIN SODIUM-DEXTROSE 2-4 GM/100ML-% IV SOLN
2.0000 g | INTRAVENOUS | Status: AC
  Administered 2021-05-06: 2 g via INTRAVENOUS
  Filled 2021-05-06: qty 100

## 2021-05-06 MED ORDER — OXYMETAZOLINE HCL 0.05 % NA SOLN
NASAL | Status: DC | PRN
  Administered 2021-05-06: 2 via NASAL

## 2021-05-06 MED ORDER — AMOXICILLIN 500 MG PO CAPS: 500.0000 mg | ORAL_CAPSULE | Freq: Three times a day (TID) | ORAL | 0 refills | Status: DC

## 2021-05-06 MED ORDER — FENTANYL CITRATE (PF) 100 MCG/2ML IJ SOLN
INTRAMUSCULAR | Status: DC | PRN
  Administered 2021-05-06: 50 ug via INTRAVENOUS

## 2021-05-06 MED ORDER — SODIUM CHLORIDE 0.9 % IR SOLN
Status: DC | PRN
  Administered 2021-05-06: 1000 mL

## 2021-05-06 MED ORDER — LIDOCAINE-EPINEPHRINE 2 %-1:100000 IJ SOLN
INTRAMUSCULAR | Status: DC | PRN
  Administered 2021-05-06: 15 mL

## 2021-05-06 MED ORDER — MEPERIDINE HCL 25 MG/ML IJ SOLN: 6.2500 mg | INTRAMUSCULAR | Status: DC | PRN

## 2021-05-06 MED ORDER — LACTATED RINGERS IV SOLN: INTRAVENOUS | Status: DC

## 2021-05-06 MED ORDER — ROCURONIUM BROMIDE 10 MG/ML (PF) SYRINGE
PREFILLED_SYRINGE | INTRAVENOUS | Status: DC | PRN
  Administered 2021-05-06: 60 mg via INTRAVENOUS

## 2021-05-06 MED ORDER — OXYCODONE HCL 5 MG/5ML PO SOLN: 5.0000 mg | Freq: Once | ORAL | Status: DC | PRN

## 2021-05-06 MED ORDER — DEXAMETHASONE SODIUM PHOSPHATE 10 MG/ML IJ SOLN
INTRAMUSCULAR | Status: AC
  Filled 2021-05-06: qty 1

## 2021-05-06 MED ORDER — OXYCODONE HCL 5 MG PO TABS
5.0000 mg | ORAL_TABLET | Freq: Once | ORAL | Status: DC | PRN
Start: 2021-05-06 — End: 2021-05-06

## 2021-05-06 MED ORDER — ALBUTEROL SULFATE HFA 108 (90 BASE) MCG/ACT IN AERS
INHALATION_SPRAY | RESPIRATORY_TRACT | Status: DC | PRN
  Administered 2021-05-06: 2 via RESPIRATORY_TRACT

## 2021-05-06 MED ORDER — PROPOFOL 10 MG/ML IV BOLUS
INTRAVENOUS | Status: AC
  Filled 2021-05-06: qty 20

## 2021-05-06 MED ORDER — ACETAMINOPHEN 500 MG PO TABS
1000.0000 mg | ORAL_TABLET | Freq: Once | ORAL | Status: AC
Start: 2021-05-06 — End: 2021-05-06
  Administered 2021-05-06: 1000 mg via ORAL
  Filled 2021-05-06: qty 2

## 2021-05-06 MED ORDER — MIDAZOLAM HCL 2 MG/2ML IJ SOLN: 0.5000 mg | Freq: Once | INTRAMUSCULAR | Status: DC | PRN

## 2021-05-06 SURGICAL SUPPLY — 36 items
BLADE SURG 15 STRL LF DISP TIS (BLADE) ×1 IMPLANT
BLADE SURG 15 STRL SS (BLADE) ×2
BUR CROSS CUT FISSURE 1.6 (BURR) ×2 IMPLANT
BUR EGG ELITE 4.0 (BURR) ×2 IMPLANT
CANISTER SUCT 3000ML PPV (MISCELLANEOUS) ×2 IMPLANT
COVER SURGICAL LIGHT HANDLE (MISCELLANEOUS) ×2 IMPLANT
COVER WAND RF STERILE (DRAPES) IMPLANT
DECANTER SPIKE VIAL GLASS SM (MISCELLANEOUS) ×2 IMPLANT
DRAPE U-SHAPE 76X120 STRL (DRAPES) ×1 IMPLANT
GAUZE PACKING FOLDED 2  STR (GAUZE/BANDAGES/DRESSINGS) ×2
GAUZE PACKING FOLDED 2 STR (GAUZE/BANDAGES/DRESSINGS) ×1 IMPLANT
GLOVE BIO SURGEON STRL SZ 6.5 (GLOVE) IMPLANT
GLOVE BIO SURGEON STRL SZ7 (GLOVE) IMPLANT
GLOVE BIO SURGEON STRL SZ8 (GLOVE) ×2 IMPLANT
GLOVE SURG UNDER POLY LF SZ6.5 (GLOVE) IMPLANT
GLOVE SURG UNDER POLY LF SZ7 (GLOVE) IMPLANT
GOWN STRL REUS W/ TWL LRG LVL3 (GOWN DISPOSABLE) ×1 IMPLANT
GOWN STRL REUS W/ TWL XL LVL3 (GOWN DISPOSABLE) ×1 IMPLANT
GOWN STRL REUS W/TWL LRG LVL3 (GOWN DISPOSABLE) ×2
GOWN STRL REUS W/TWL XL LVL3 (GOWN DISPOSABLE) ×2
IV NS 1000ML (IV SOLUTION) ×2
IV NS 1000ML BAXH (IV SOLUTION) ×1 IMPLANT
KIT BASIN OR (CUSTOM PROCEDURE TRAY) ×2 IMPLANT
KIT TURNOVER KIT B (KITS) ×2 IMPLANT
NDL HYPO 25GX1X1/2 BEV (NEEDLE) ×2 IMPLANT
NEEDLE HYPO 25GX1X1/2 BEV (NEEDLE) ×4 IMPLANT
NS IRRIG 1000ML POUR BTL (IV SOLUTION) ×2 IMPLANT
PAD ARMBOARD 7.5X6 YLW CONV (MISCELLANEOUS) ×2 IMPLANT
SLEEVE IRRIGATION ELITE 7 (MISCELLANEOUS) ×2 IMPLANT
SPONGE SURGIFOAM ABS GEL 12-7 (HEMOSTASIS) IMPLANT
SUT CHROMIC 3 0 PS 2 (SUTURE) ×3 IMPLANT
SYR BULB IRRIG 60ML STRL (SYRINGE) ×2 IMPLANT
SYR CONTROL 10ML LL (SYRINGE) ×2 IMPLANT
TRAY ENT MC OR (CUSTOM PROCEDURE TRAY) ×2 IMPLANT
TUBING IRRIGATION (MISCELLANEOUS) ×2 IMPLANT
YANKAUER SUCT BULB TIP NO VENT (SUCTIONS) ×2 IMPLANT

## 2021-05-06 NOTE — Op Note (Signed)
05/06/2021  9:28 AM  PATIENT:  Mackenzie Castillo  50 y.o. female  PRE-OPERATIVE DIAGNOSIS:  NON RESTORABLE TEETH NUMBER TWO, FIVE, FIFTEEN, TWENTY-NINE, THIRTY  POST-OPERATIVE DIAGNOSIS:  SAME  PROCEDURE:  Procedure(s): DENTAL EXTRACTIONS OF TEETH NUMBER TWO, FIVE, FIFTEEN, TWENTY-NINE, THIRTY  SURGEON:  Surgeon(s): Diona Browner, DMD  ANESTHESIA:   local and general  EBL:  minimal  DRAINS: none   SPECIMEN:  No Specimen  COUNTS:  YES  PLAN OF CARE: Discharge to home after PACU  PATIENT DISPOSITION:  PACU - hemodynamically stable.   PROCEDURE DETAILS: Dictation # [id number]  Gae Bon, DMD 05/06/2021 9:28 AM

## 2021-05-06 NOTE — Progress Notes (Signed)
Pt stable and in NAD, moved to phase 2. Pt does not require monitoring, waiting for the spouse to call back for discharge info.

## 2021-05-06 NOTE — Transfer of Care (Signed)
Immediate Anesthesia Transfer of Care Note  Patient: Mackenzie Castillo  Procedure(s) Performed: DENTAL EXTRACTIONS OF TEETH NUMBER TWO, FIVE, FIFTEEN, TWENTY-NINE, THIRTY (N/A Mouth)  Patient Location: PACU  Anesthesia Type:General  Level of Consciousness: awake, alert  and oriented  Airway & Oxygen Therapy: Patient Spontanous Breathing and Patient connected to face mask oxygen  Post-op Assessment: Report given to RN and Post -op Vital signs reviewed and stable  Post vital signs: Reviewed and stable  Last Vitals:  Vitals Value Taken Time  BP 109/70 05/06/21 1025  Temp 36.6 C 05/06/21 1025  Pulse 73 05/06/21 1026  Resp 11 05/06/21 1026  SpO2 92 % 05/06/21 1026  Vitals shown include unvalidated device data.  Last Pain:  Vitals:   05/06/21 1025  TempSrc:   PainSc: 0-No pain      Patients Stated Pain Goal: 3 (20/94/70 9628)  Complications: No complications documented.

## 2021-05-06 NOTE — Progress Notes (Signed)
Per dr Glennon Mac no need for Urine HCG before surgery today

## 2021-05-06 NOTE — Anesthesia Postprocedure Evaluation (Signed)
Anesthesia Post Note  Patient: Mackenzie Castillo  Procedure(s) Performed: DENTAL EXTRACTIONS OF TEETH NUMBER TWO, FIVE, FIFTEEN, TWENTY-NINE, THIRTY (N/A Mouth)     Patient location during evaluation: PACU Anesthesia Type: General Level of consciousness: awake and alert, patient cooperative and oriented Pain management: pain level controlled Vital Signs Assessment: post-procedure vital signs reviewed and stable Respiratory status: spontaneous breathing, nonlabored ventilation and respiratory function stable Cardiovascular status: blood pressure returned to baseline and stable Postop Assessment: no apparent nausea or vomiting and able to ambulate Anesthetic complications: no   No complications documented.  Last Vitals:  Vitals:   05/06/21 1010 05/06/21 1025  BP: 113/65 109/70  Pulse: 73 73  Resp: 20 14  Temp:  36.6 C  SpO2: 92% 95%    Last Pain:  Vitals:   05/06/21 1025  TempSrc:   PainSc: 0-No pain                 Gudrun Axe,E. Therese Rocco

## 2021-05-06 NOTE — Anesthesia Procedure Notes (Signed)
Procedure Name: Intubation Date/Time: 05/06/2021 9:03 AM Performed by: Genelle Bal, CRNA Pre-anesthesia Checklist: Patient identified, Emergency Drugs available, Suction available and Patient being monitored Patient Re-evaluated:Patient Re-evaluated prior to induction Oxygen Delivery Method: Circle system utilized Preoxygenation: Pre-oxygenation with 100% oxygen Induction Type: IV induction Ventilation: Mask ventilation without difficulty Laryngoscope Size: Glidescope and 3 Grade View: Grade I Nasal Tubes: Nasal prep performed, Nasal Rae, Right and Magill forceps- large, utilized Tube size: 7.0 mm Number of attempts: 1 Airway Equipment and Method: Video-laryngoscopy Placement Confirmation: ETT inserted through vocal cords under direct vision,  positive ETCO2 and breath sounds checked- equal and bilateral Secured at: 27 cm Tube secured with: Tape Dental Injury: Teeth and Oropharynx as per pre-operative assessment

## 2021-05-06 NOTE — H&P (Signed)
H&P documentation  -History and Physical Reviewed  -Patient has been re-examined  -No change in the plan of care  Mackenzie Castillo  

## 2021-05-07 ENCOUNTER — Encounter (HOSPITAL_COMMUNITY): Payer: Self-pay | Admitting: Oral Surgery

## 2021-05-07 NOTE — Op Note (Signed)
[name], Mackenzie Castillo MEDICAL RECORD NO: [id number] ACCOUNT NO: [id number] DATE OF BIRTH: [birth year] FACILITY: [facility] LOCATION: [facility] PHYSICIAN: Gae Bon, DDS  Operative Report   DATE OF PROCEDURE: 05/06/2021   PREOPERATIVE DIAGNOSIS:  Nonrestorable teeth numbers 2, 5, 15, 29 and 30.  POSTOPERATIVE DIAGNOSIS:  Nonrestorable teeth numbers 2, 5, 15, 29 and 30.   PROCEDURE PERFORMED:  Extraction of teeth #2, 5, 15, 29 and 30.  SURGEON:  Gae Bon, DDS.  ANESTHESIA:  General.  Silas Flood attending.  DESCRIPTION OF PROCEDURE:  The patient was taken to the operating room and placed on the table in supine position.  General anesthesia was administered.  A nasal endotracheal tube was placed and secured and the eyes were protected.  The patient was  draped for surgery.  Timeout was performed.  The posterior pharynx was suctioned and a throat pack was placed, 2% lidocaine 1:100,000 epinephrine was infiltrated in an inferior alveolar block on the right side and a buccal and palatal infiltration around  the maxillary teeth to be removed.  A bite block was placed on the right side of the mouth and a sweetheart retractor was used to retract the tongue.  A 15 blade was used to make an incision around tooth #15.  The periosteum was elevated with a  periosteal elevator.  Then, 301 elevator was used to luxate the tooth.  The tooth was removed with the forceps, but the mesial buccal root remained.  A Stryker handpiece with a fissure bur was used to remove bone around this root and then the root was  removed with the rongeurs.  Socket was curetted and irrigated and the bite block was repositioned to the other side of the mouth for surgery on the right side, a 15 blade was used to make an incision around teeth #29 and 30.  The periosteum was  reflected.  The teeth were elevated.  Tooth #30 fractured upon attempted removal, necessitating removal of bone circumferentially around the roots  and then the roots were removed with a 301 elevator.  Tooth #29 was removed with dental forceps and the  sockets were curetted.  There was a copious amount of granulation tissue, which was removed with a dental curette and the rongeur and then the area was irrigated and closed with 3-0 chromic and the maxilla teeth #2 and 5 were incised in the gingival  sulcus with a 15 blade, the periosteum was reflected.  Tooth #2 was elevated and removed with dental forceps.  Tooth #5 was elevated, but the crown fractured upon attempted removal.  The periosteum was further reflected to expose the alveolar bone and  then the Stryker handpiece was used to remove bone around the tooth.  The tooth came out in multiple sections, but eventually was removed.  Then, the socket was curetted, irrigated and closed with 3-0 chromic.  Then, the oral cavity was irrigated and  suctioned.  The throat pack was removed.  The patient was left under the care of anesthesia for transport to recovery room with plans for discharge on through day surgery.  ESTIMATED BLOOD LOSS:  Minimal.  COMPLICATIONS:  None.  SPECIMENS:  None.   Elián.Darby D: 05/06/2021 9:32:05 am T: 05/07/2021 2:33:00 am  JOB: [id number]/ [id number]

## 2021-06-07 NOTE — Progress Notes (Addendum)
IBM'd Dr. Landry Mellow requesting procedure orders for upcoming surgery.

## 2021-06-07 NOTE — Pre-Procedure Instructions (Signed)
Surgical Instructions    Your procedure is scheduled on Wednesday, June 22nd.  Report to [facility] Main Entrance "A" at 07:30 A.M., then check in with the Admitting office.  Call this number if you have problems the morning of surgery:  [phone]   If you have any questions prior to your surgery date call [phone]: Open Monday-Friday 8am-4pm    Remember:  Do not eat after midnight the night before your surgery.  You may drink clear liquids until 06:30 AM the morning of your surgery.   Clear liquids allowed are: Water, Non-Citrus Juices (without pulp), Carbonated Beverages, Clear Tea, Black Coffee Only, and Gatorade.    Take these medicines the morning of surgery with A SIP OF WATER: cetirizine (ZYRTEC)  SYMBICORT inhaler  IF NEEDED:  PROAIR inhaler Please bring all inhalers with you the day of surgery.    As of today, STOP taking any Aspirin (unless otherwise instructed by your surgeon) Aleve, Naproxen, Ibuprofen, Motrin, Advil, Goody's, BC's, all herbal medications, fish oil, and all vitamins.            Do not wear jewelry or makeup Do not wear lotions, powders, perfumes, or deodorant. Do not shave 48 hours prior to surgery.   Do not bring valuables to the hospital. DO Not wear nail polish, gel polish, artificial nails, or any other type of covering on natural nails including finger and toenails. If patients have artificial nails, gel coating, etc. that need to be removed by a nail salon please have this removed prior to surgery or surgery may need to be canceled/delayed if the surgeon/ anesthesia feels like the patient is unable to be adequately monitored.             [facility] is not responsible for any belongings or valuables.  Do NOT Smoke (Tobacco/Vaping) or drink Alcohol 24 hours prior to your procedure.  If you use a CPAP at night, you may bring all equipment for your overnight stay.   Contacts, glasses, dentures or bridgework may not be worn into  surgery, please bring cases for these belongings   For patients admitted to the hospital, discharge time will be determined by your treatment team.   Patients discharged the day of surgery will not be allowed to drive home, and someone needs to stay with them for 24 hours.  ONLY 1 SUPPORT PERSON MAY BE PRESENT WHILE YOU ARE IN SURGERY. IF YOU ARE TO BE ADMITTED ONCE YOU ARE IN YOUR ROOM YOU WILL BE ALLOWED TWO (2) VISITORS.  Minor children may have two parents present. Special consideration for safety and communication needs will be reviewed on a case by case basis.  Special instructions:    Oral Hygiene is also important to reduce your risk of infection.  Remember - BRUSH YOUR TEETH THE MORNING OF SURGERY WITH YOUR REGULAR TOOTHPASTE   [facility]- Preparing For Surgery  Before surgery, you can play an important role. Because skin is not sterile, your skin needs to be as free of germs as possible. You can reduce the number of germs on your skin by washing with CHG (chlorahexidine gluconate) Soap before surgery.  CHG is an antiseptic cleaner which kills germs and bonds with the skin to continue killing germs even after washing.     Please do not use if you have an allergy to CHG or antibacterial soaps. If your skin becomes reddened/irritated stop using the CHG.  Do not shave (including legs and underarms) for at least 48  hours prior to first CHG shower. It is OK to shave your face.  Please follow these instructions carefully.     Shower the NIGHT BEFORE SURGERY and the MORNING OF SURGERY with CHG Soap.   If you chose to wash your hair, wash your hair first as usual with your normal shampoo. After you shampoo, rinse your hair and body thoroughly to remove the shampoo.  Then [employer] and genitals (private parts) with your normal soap and rinse thoroughly to remove soap.  After that Use CHG Soap as you would any other liquid soap. You can apply CHG directly to the skin and wash gently with a  scrungie or a clean washcloth.   Apply the CHG Soap to your body ONLY FROM THE NECK DOWN.  Do not use on open wounds or open sores. Avoid contact with your eyes, ears, mouth and genitals (private parts). Wash Face and genitals (private parts)  with your normal soap.   Wash thoroughly, paying special attention to the area where your surgery will be performed.  Thoroughly rinse your body with warm water from the neck down.  DO NOT shower/wash with your normal soap after using and rinsing off the CHG Soap.  Pat yourself dry with a CLEAN TOWEL.  Wear CLEAN PAJAMAS to bed the night before surgery  Place CLEAN SHEETS on your bed the night before your surgery  DO NOT SLEEP WITH PETS.   Day of Surgery:  Take a shower with CHG soap. Wear Clean/Comfortable clothing the morning of surgery Do not apply any deodorants/lotions.   Remember to brush your teeth WITH YOUR REGULAR TOOTHPASTE.   Please read over the following fact sheets that you were given.

## 2021-06-08 ENCOUNTER — Encounter (HOSPITAL_COMMUNITY): Payer: Self-pay

## 2021-06-08 ENCOUNTER — Encounter (HOSPITAL_COMMUNITY)
Admission: RE | Admit: 2021-06-08 | Discharge: 2021-06-08 | Disposition: A | Discharge status: Home / Self Care | Payer: Medicaid Other | Source: Ambulatory Visit | Attending: Obstetrics and Gynecology | Admitting: Obstetrics and Gynecology

## 2021-06-08 ENCOUNTER — Other Ambulatory Visit: Payer: Self-pay

## 2021-06-08 DIAGNOSIS — Z792 Long term (current) use of antibiotics: Secondary | ICD-10-CM | POA: Diagnosis not present

## 2021-06-08 DIAGNOSIS — Z6841 Body Mass Index (BMI) 40.0 and over, adult: Secondary | ICD-10-CM | POA: Diagnosis not present

## 2021-06-08 DIAGNOSIS — J45909 Unspecified asthma, uncomplicated: Secondary | ICD-10-CM | POA: Diagnosis not present

## 2021-06-08 DIAGNOSIS — Z79899 Other long term (current) drug therapy: Secondary | ICD-10-CM | POA: Insufficient documentation

## 2021-06-08 DIAGNOSIS — D259 Leiomyoma of uterus, unspecified: Secondary | ICD-10-CM | POA: Diagnosis not present

## 2021-06-08 DIAGNOSIS — G4733 Obstructive sleep apnea (adult) (pediatric): Secondary | ICD-10-CM | POA: Insufficient documentation

## 2021-06-08 DIAGNOSIS — Z01812 Encounter for preprocedural laboratory examination: Secondary | ICD-10-CM | POA: Insufficient documentation

## 2021-06-08 DIAGNOSIS — Z7951 Long term (current) use of inhaled steroids: Secondary | ICD-10-CM | POA: Diagnosis not present

## 2021-06-08 DIAGNOSIS — Z9884 Bariatric surgery status: Secondary | ICD-10-CM | POA: Diagnosis not present

## 2021-06-08 DIAGNOSIS — Z87891 Personal history of nicotine dependence: Secondary | ICD-10-CM | POA: Insufficient documentation

## 2021-06-08 DIAGNOSIS — E119 Type 2 diabetes mellitus without complications: Secondary | ICD-10-CM | POA: Diagnosis not present

## 2021-06-08 HISTORY — DX: Obstructive sleep apnea (adult) (pediatric): G47.33

## 2021-06-08 HISTORY — DX: Anxiety disorder, unspecified: F41.9

## 2021-06-08 LAB — HEMOGLOBIN A1C
Hgb A1c MFr Bld: 7.2 % — ABNORMAL HIGH (ref 4.8–5.6)
Mean Plasma Glucose: 160 mg/dL

## 2021-06-08 LAB — CBC
HCT: 39.7 % (ref 36.0–46.0)
Hemoglobin: 11.4 g/dL — ABNORMAL LOW (ref 12.0–15.0)
MCH: 18.2 pg — ABNORMAL LOW (ref 26.0–34.0)
MCHC: 28.7 g/dL — ABNORMAL LOW (ref 30.0–36.0)
MCV: 63.3 fL — ABNORMAL LOW (ref 80.0–100.0)
Platelets: 311 10*3/uL (ref 150–400)
RBC: 6.27 MIL/uL — ABNORMAL HIGH (ref 3.87–5.11)
RDW: 20.6 % — ABNORMAL HIGH (ref 11.5–15.5)
WBC: 8.6 10*3/uL (ref 4.0–10.5)
nRBC: 0 % (ref 0.0–0.2)

## 2021-06-08 LAB — BASIC METABOLIC PANEL
Anion gap: 8 (ref 5–15)
BUN: 13 mg/dL (ref 6–20)
CO2: 28 mmol/L (ref 22–32)
Calcium: 9.4 mg/dL (ref 8.9–10.3)
Chloride: 100 mmol/L (ref 98–111)
Creatinine, Ser: 0.72 mg/dL (ref 0.44–1.00)
GFR, Estimated: >60 mL/min (ref >60)
Glucose, Bld: 127 mg/dL — ABNORMAL HIGH (ref 70–99)
Potassium: 4.4 mmol/L (ref 3.5–5.1)
Sodium: 136 mmol/L (ref 135–145)

## 2021-06-08 LAB — GLUCOSE, CAPILLARY: Glucose-Capillary: 140 mg/dL — ABNORMAL HIGH (ref 70–99)

## 2021-06-08 NOTE — Progress Notes (Signed)
PCP - Dr. Ernestine Conrad Cardiologist - denies  PPM/ICD - n/a Device Orders - n/a Rep Notified - n/a  Chest x-ray - n/a EKG - 05/06/21 Stress Test - denies ECHO - 03/09/2004 Cardiac Cath - denies  Sleep Study - Yes. Home sleep study 03/14/21. Notes in EPIC CPAP - No.  Fasting Blood Sugar - 140 Checks Blood Sugar zero times a day. Patient states she does not check her blood sugar at home and does not have a CBG machine  Blood Thinner Instructions: n/a Aspirin Instructions: n/a  ERAS Protcol - Clear liquids until 6:30 a.m. day of surgery  COVID TEST- Scheduled for 06/13/21 at 10:30. Patient is aware of date, time, and location   Anesthesia review: Yes.   Patient denies shortness of breath, fever, cough and chest pain at PAT appointment   All instructions explained to the patient, with a verbal understanding of the material. Patient agrees to go over the instructions while at home for a better understanding. Patient also instructed to self quarantine after being tested for COVID-19. The opportunity to ask questions was provided.

## 2021-06-09 ENCOUNTER — Encounter (HOSPITAL_COMMUNITY): Payer: Self-pay

## 2021-06-09 NOTE — Anesthesia Preprocedure Evaluation (Addendum)
Anesthesia Evaluation  Patient identified by MRN, date of birth, ID band Patient awake    Reviewed: Allergy & Precautions, NPO status , Patient's Chart, lab work & pertinent test results  Airway Mallampati: II  TM Distance: >3 FB Neck ROM: Full    Dental  (+) Missing, Dental Advisory Given,    Pulmonary asthma (uses rescue inhaler a few times a month) , sleep apnea (mild OSA, no CPAP yet) , Current Smoker,  1ppd x 10 years symbicort- used this AM   Pulmonary exam normal breath sounds clear to auscultation       Cardiovascular negative cardio ROS Normal cardiovascular exam Rhythm:Regular Rate:Normal     Neuro/Psych negative neurological ROS  negative psych ROS   GI/Hepatic Neg liver ROS, S/p gastric bypass 2005   Endo/Other  diabetes, Well Controlled, Type 2, Oral Hypoglycemic AgentsMorbid obesityBMI 40 a1c 7.2  Renal/GU negative Renal ROS  negative genitourinary   Musculoskeletal negative musculoskeletal ROS (+)   Abdominal (+) + obese,   Peds negative pediatric ROS (+)  Hematology  (+) Blood dyscrasia, anemia , hct 39.7   Anesthesia Other Findings   Reproductive/Obstetrics negative OB ROS                            Anesthesia Physical Anesthesia Plan  ASA: 3  Anesthesia Plan: General   Post-op Pain Management:    Induction: Intravenous  PONV Risk Score and Plan: 3 and Ondansetron, Midazolam, Dexamethasone, Scopolamine patch - Pre-op and Treatment may vary due to age or medical condition  Airway Management Planned: Oral ETT  Additional Equipment: None  Intra-op Plan:   Post-operative Plan: Extubation in OR  Informed Consent: I have reviewed the patients History and Physical, chart, labs and discussed the procedure including the risks, benefits and alternatives for the proposed anesthesia with the patient or authorized representative who has indicated his/her understanding  and acceptance.     Dental advisory given  Plan Discussed with: CRNA  Anesthesia Plan Comments: (  S/p teeth extractions x5 05/06/21 under general anesthesia--"easy mask and VideoGlide assisted NT intubation, bronchospasm and wheezing resolved with Sevo" by anesthesia records.)       Anesthesia Quick Evaluation

## 2021-06-09 NOTE — Progress Notes (Signed)
Anesthesia Chart Review:  Case: [id number] Date/Time: 06/15/21 0925   Procedure: TOTAL HYSTERECTOMY ABDOMINAL WITH LEFT SALPINGO-OOPHERECTOMY.   Anesthesia type: Choice   Pre-op diagnosis: Fibroids (D25.9)   Location: [facility]   Surgeons: Christophe Louis, MD       DISCUSSION: Patient is a 50 year old female scheduled for the above procedure.  History includes smoking, DM2, asthma, anemia, uterine fibroids, laparoscopic Roux-en-Y gastric bypass/cholecystectomy (06/06/04), OSA (mild by 03/14/21 study). BMI is consistent with morbid obesity. S/p teeth extractions x5 05/06/21 under general anesthesia--"easy mask and VideoGlide assisted NT intubation, bronchospasm and wheezing resolved with Sevo" by anesthesia records.  Preoperative COVID-19 testing is scheduled for 06/13/2021.  Anesthesia team to evaluate on the day of surgery.   VS: BP 138/76   Pulse 70   Temp 36.4 C (Oral)   Resp 17   Ht 5\' 3"  (1.6 m)   Wt 105.3 kg   LMP 06/09/2019 (Approximate)   SpO2 100%   BMI 41.11 kg/m   PROVIDERS: Shirline Frees, MD is PCP Star Age, MD is neurologist (OSA)  LABS: Labs reviewed: Acceptable for surgery. (all labs ordered are listed, but only abnormal results are displayed)  Labs Reviewed  GLUCOSE, CAPILLARY - Abnormal; Notable for the following components:      Result Value   Glucose-Capillary 140 (*)    All other components within normal limits  BASIC METABOLIC PANEL - Abnormal; Notable for the following components:   Glucose, Bld 127 (*)    All other components within normal limits  CBC - Abnormal; Notable for the following components:   RBC 6.27 (*)    Hemoglobin 11.4 (*)    MCV 63.3 (*)    MCH 18.2 (*)    MCHC 28.7 (*)    RDW 20.6 (*)    All other components within normal limits  HEMOGLOBIN A1C - Abnormal; Notable for the following components:   Hgb A1c MFr Bld 7.2 (*)    All other components within normal limits  TYPE AND SCREEN     OTHER: Home Sleep Test  03/14/21: FINDINGS:  Total Record Time (hours, min): 7 H 5 min  Total Sleep Time (hours, min):  5 H 35 min  Percent REM (%):    24.35 %   Calculated pAHI (per hour): 7.4       REM pAHI: 10.3    NREM  pAHI: 6.5 Supine AHI: 9.1    Oxygen Saturation (%) Mean: 92  Minimum oxygen saturation (%): 87  O2 Saturation Range (%): 87-96  O2Saturation (minutes) <=88%: 0.2 min  Pulse Mean (bpm):    65  Pulse Range (54-80)   IMPRESSION: OSA (obstructive sleep apnea), mild   RECOMMENDATION:  This home sleep test demonstrates overall mild obstructive sleep  apnea with a total AHI of 7.4/hour and O2 nadir of 87%. Given the  patient's medical history and sleep related complaints, treatment  with positive airway pressure is recommended.   EKG: 05/06/21: NSR   CV: 2005 limited echo images prior to bariatric surgery showed "probably OK" LV/RV function.   Past Medical History:  Diagnosis Date   Anemia    Anxiety    Asthma    Constipation    treat with diet change   Depression    Diabetes mellitus without complication ([place])    type II   Headache    OSA (obstructive sleep apnea)    mild OSA by 03/14/21 HST   Pneumonia 2020   Uterine fibroids  Past Surgical History:  Procedure Laterality Date   CESAREAN SECTION     x 2   CHOLECYSTECTOMY     GASTRIC BYPASS  2005   LAPAROSCOPY     TOOTH EXTRACTION N/A 05/06/2021   Procedure: DENTAL EXTRACTIONS OF TEETH NUMBER TWO, FIVE, FIFTEEN, TWENTY-NINE, THIRTY;  Surgeon: Diona Browner, DMD;  Location: [facility] OR;  Service: Oral Surgery;  Laterality: N/A;    MEDICATIONS:  amoxicillin (AMOXIL) 500 MG capsule   cetirizine (ZYRTEC) 10 MG tablet   citalopram (CELEXA) 40 MG tablet   HYDROcodone-acetaminophen (NORCO) 5-325 MG tablet   Levomefolate Glucosamine (METHYLFOLATE PO)   Melatonin 10 MG TABS   PROAIR HFA 108 (90 Base) MCG/ACT inhaler   SYMBICORT 160-4.5 MCG/ACT inhaler   traZODone (DESYREL) 100 MG tablet   No current facility-administered  medications for this encounter.    Myra Gianotti, PA-C Surgical Short Stay/Anesthesiology [facility] Phone [phone] [facility] Phone [phone] 06/09/2021 2:16 PM

## 2021-06-13 ENCOUNTER — Other Ambulatory Visit (HOSPITAL_COMMUNITY)
Admission: RE | Admit: 2021-06-13 | Discharge: 2021-06-13 | Disposition: A | Discharge status: Home / Self Care | Payer: Medicaid Other | Source: Ambulatory Visit | Attending: Obstetrics and Gynecology | Admitting: Obstetrics and Gynecology

## 2021-06-13 ENCOUNTER — Encounter: Payer: Self-pay | Admitting: Neurology

## 2021-06-13 ENCOUNTER — Ambulatory Visit: Payer: Medicaid Other | Admitting: Neurology

## 2021-06-13 ENCOUNTER — Other Ambulatory Visit: Payer: Self-pay | Admitting: Obstetrics and Gynecology

## 2021-06-13 VITALS — BP 122/70 | HR 74 | Ht 64.0 in | Wt 234.3 lb

## 2021-06-13 DIAGNOSIS — Z20822 Contact with and (suspected) exposure to covid-19: Secondary | ICD-10-CM | POA: Insufficient documentation

## 2021-06-13 DIAGNOSIS — F439 Reaction to severe stress, unspecified: Secondary | ICD-10-CM

## 2021-06-13 DIAGNOSIS — G44209 Tension-type headache, unspecified, not intractable: Secondary | ICD-10-CM

## 2021-06-13 DIAGNOSIS — Z01812 Encounter for preprocedural laboratory examination: Secondary | ICD-10-CM | POA: Insufficient documentation

## 2021-06-13 DIAGNOSIS — G4733 Obstructive sleep apnea (adult) (pediatric): Secondary | ICD-10-CM

## 2021-06-13 DIAGNOSIS — R519 Headache, unspecified: Secondary | ICD-10-CM | POA: Diagnosis not present

## 2021-06-13 LAB — SARS CORONAVIRUS 2 (TAT 6-24 HRS): SARS Coronavirus 2: NEGATIVE

## 2021-06-13 NOTE — H&P (Signed)
Patient:Mackenzie Castillo, Mackenzie Castillo, Mackenzie Castillo DOB: 1971/02/10 Age: 50 Y Sex: Female Phone: [phone] Primary Insurance: [employer] [place] Payer ID: [id number] Address: [address] [place], [place] Account Number: [id number] PCP: Blythe Stanford, IV  Encounter Date: 06/02/2021 Provider: Christophe Louis, MD Appointment [facility]: [facility]   Subjective: Chief Complaint(s):   Preop- Fibroids   HPI:  Isolation Precautions Yes- Moderna" label="Has patient received COVID-19 vaccination?" propId="[id number]" catId="[id number]" encId="[id number]"Has patient received COVID-19 vaccination? Yes- Moderna" itemId="25066" categoryId="[id number]"Yes- Moderna. Does patient report new onset of COVID symptoms? No. Has patient or close contact tested positive for COVID-19? No , not in the past 2 weeks.  General 50 y/o presents for preoperative examination prior to TAH/LSO on 06/15/21 at 9:40am for definitive treatment of fibroids.  Pt had a CT of abd and pelvic on 09/20/2020 which revealed enlarged uterus due to uterine fibroids that extended to the umbilicus measuring 86PY in length, pedunculated fibroids to the right measuring 5cm.  She had a transvaginal ultrasound 03/28/2021 revealed uterus measuring 19.6 x 13.2 x 17.1 cm. Endometrium could not be evaluated due to fibroids. Largest fibroid is 13.1 x 11.7 x 12 cm from the midline. Another fibroid located in the posterior lower uterine segment that is 9.6 cm. Both ovaries not visualized.  Pt with h/o irregular and infrequent menses. LMP 05/2020. She was last seen on 03/28/2021 and reported feeling pressure in her pelvis. sexually inactive for the past 3 years. She has h/o C-section x2 and right tube and ovary removed. Pt Reported occasional hot flashes and is aware this may worsen after lt ov removal, pt opts to continue with LSO. She has h/o DM type 2, last Hgb A1c performed 01/26/21 was 7.3.  TODAY:  Pt is doing well at this time. Pt is taking Symbicort, Glucosamine, Metformin,  TraZODone, Celexa, ProAir, HFA, Zyrtec. Pt reports she had right ovary and right fallopian tube removed in the past. Surgical Hx: C section x 2, cholecystectomy, laparoscopy, laparoscopic gastric bypass, Roux-en-y 05/2004, (R) tube & ovary removed, 2-3 tooth extractions 05/06/2021. DM well controlled at this time per pt. Hemoglobin A1C: 7.3. Denies smoking or alcohol use. She is married and currently unemployed. Current Medication: Taking  Symbicort(Budesonide-Formoterol Fumarate) 160-4.5 MCG/ACT Aerosol 2 puffs Inhalation Twice a day, Notes: as needed.     MethylFolate(Levomefolate Glucosamine) 400 MCG Capsule as directed Orally.     metFORMIN HCl 500 MG Tablet 1 tablet Orally Twice a day for diabetes.     traZODone HCl 100 MG Tablet 1 tablet Orally at bedtime, Notes: prn.     CeleXA(Citalopram Hydrobromide) 40MG  Tablet 1 tablet Orally Once a day.     ProAir HFA(Albuterol Sulfate HFA) 108 (90 Base) MCG/ACT Aerosol Solution 1 PUFF INHALATION EVERY 4 HRS 34 DAYS inhalation as needed as every 4 hrs.     ZyrTEC Allergy(Cetirizine HCl) 10 MG Capsule 1 capsule Orally Once a day.     MVI.     Medication List reviewed and reconciled with the patient.  Medical History:  diabetes mellitus, type II     Asthma     Allergic rhinitis     insomnia     neck and back pain from fibromyalgia      Allergies/Intolerance: Redux: Side Effects - depression  Cyclobenzaprine HCl Gyn History:  Sexual activity not currently sexually active. Periods : postmenopausal. LMP 05/2019 per pt. Birth control none. Last pap smear date 07/15/20. Last mammogram date 08/06/2020 - normal. H/O Abnormal pap smear yes, assessed with colposcopy. STD none.   OB  History:  Number of pregnancies 2. Pregnancy # 1 C-section delivery. Pregnancy # 2 C-section.   Surgical History:  C section x 2     cholecystectomy     diagnostic laparoscopy     laparoscopic gastric bypass, Roux-en-y 05/2004     (R) tube & ovary  removed after 2003     2-3 tooth extractions 05/06/2021   Hospitalization:  No Hospitalization History.   Family History:  Father: alive, HTN, heart attack, hyperlipidemia, stroke, skin cancer, diagnosed with Hypertension, CVA    Mother: alive, HTN, diabetes, hyperlipidemia, diagnosed with Diabetes, Hypertension    Paternal Cross Father: deceased    Paternal Grand Mother: deceased    Maternal Grand Father: deceased, HTN, diagnosed with Hypertension    Maternal Grand Mother: deceased, HTN, diabetes, diagnosed with Diabetes    Brother 1: alive, HTN    Sister 1: alive, HTN, diabetes    Sister 2: alive  Social History: General Tobacco use cigarettes: Current smoker, Frequency: 1 PPD, Tobacco history last updated 06/02/2021, Vaping No.  no EXPOSURE TO PASSIVE SMOKE.  no Alcohol, [facility], Cristy 06/23/2020 10:29:14 AM > Rare,.  Caffeine: yes, coffee, soda, tea - 6 servings daily.  no Recreational drug use.  Exercise: 1-2 times per week.  Marital Status: married, Jaleeyah Munce.  Children: Boys, 1, girls, 1.  OCCUPATION: unemployed.  Religion: Christian.  Seat belt use: yes.  ROS: CONSTITUTIONAL No" label="Chills" value="" options="no,yes" propid="91" itemid="193425" categoryid="10464" encounterid="[id number]"Chills No. No" label="Fatigue" value="" options="no,yes" propid="91" itemid="172899" categoryid="10464" encounterid="[id number]"Fatigue No. No" label="Fever" value="" options="no,yes" propid="91" itemid="10467" categoryid="10464" encounterid="[id number]"Fever No. No" label="Night sweats" value="" options="no,yes" propid="91" itemid="193426" categoryid="10464" encounterid="[id number]"Night sweats No. No" label="Recent travel outside [place]" value="" options="no,yes" propid="91" itemid="444261" categoryid="10464" encounterid="[id number]"Recent travel outside [place] No. No" label="Sweats" value="" options="no,yes" propid="91" itemid="193427" categoryid="10464" encounterid="[id number]"Sweats No.  No" label="Weight change" value="" options="no,yes" propid="91" itemid="194825" categoryid="10464" encounterid="[id number]"Weight change No.  OPHTHALMOLOGY no" label="Blurring of vision" value="" options="no,yes" propid="91" itemid="12520" categoryid="12516" encounterid="[id number]"Blurring of vision no. no" label="Change in vision" value="" options="no,yes" propid="91" itemid="193469" categoryid="12516" encounterid="[id number]"Change in vision no. no" label="Double vision" value="" options="no,yes" propid="91" itemid="194379" categoryid="12516" encounterid="[id number]"Double vision no.  ENT no" label="Dizziness" value="" options="no,yes" propid="91" itemid="193612" categoryid="10481" encounterid="[id number]"Dizziness no. Nose bleeds no. Sore throat no. Teeth pain no.  ALLERGY no" label="Hives" value="" options="no,yes" propid="91" itemid="202589" categoryid="138152" encounterid="[id number]"Hives no.  CARDIOLOGY no" label="Chest pain" value="" options="no,yes" propid="91" itemid="193603" categoryid="10488" encounterid="[id number]"Chest pain no. no" label="High blood pressure" value="" options="no,yes" propid="91" itemid="199089" categoryid="10488" encounterid="[id number]"High blood pressure no. no" label="Irregular heart beat" value="" options="no,yes" propid="91" itemid="202598" categoryid="10488" encounterid="[id number]"Irregular heart beat no. no" label="Leg edema" value="" options="no,yes" propid="91" itemid="10491" categoryid="10488" encounterid="[id number]"Leg edema no. no" label="Palpitations" value="" options="no,yes" propid="91" itemid="10490" categoryid="10488" encounterid="[id number]"Palpitations no.  RESPIRATORY no" label="Shortness of breath" value="" options="no" propid="91" itemid="270013" categoryid="138132" encounterid="[id number]"Shortness of breath no. no" label="Cough" value="" options="no,yes" propid="91" itemid="172745" categoryid="138132" encounterid="[id number]"Cough no. no" label="Wheezing" value=""  options="no,yes" propid="91" itemid="193621" categoryid="138132" encounterid="[id number]"Wheezing no.  UROLOGY no" label="Pain with urination" value="" options="no,yes" propid="91" itemid="194377" categoryid="138166" encounterid="[id number]"Pain with urination no. no" label="Urinary urgency" value="" options="no,yes" propid="91" itemid="193493" categoryid="138166" encounterid="[id number]"Urinary urgency no. no" label="Urinary frequency" value="" options="no,yes" propid="91" itemid="193492" categoryid="138166" encounterid="[id number]"Urinary frequency no. no" label="Urinary incontinence" value="" options="no,yes" propid="91" itemid="138171" categoryid="138166" encounterid="[id number]"Urinary incontinence no. No" label="Difficulty urinating" value="" options="no,yes" propid="91" itemid="138167" categoryid="138166" encounterid="[id number]"Difficulty urinating No. No" label="Blood in urine" value="" options="no,yes" propid="91" itemid="138168" categoryid="138166" encounterid="[id number]"Blood in urine No.  GASTROENTEROLOGY no" label="Abdominal pain" value="" options="no,yes" propid="91" itemid="10496" categoryid="10494" encounterid="[id number]"Abdominal pain no. no" label="Appetite change" value="" options="no,yes" propid="91" itemid="193447" categoryid="10494" encounterid="[id number]"Appetite change no. no" label="Bloating/belching" value="" options="no,yes" propid="91" itemid="193448" categoryid="10494" encounterid="[id number]"Bloating/belching no. no" label="Blood in stool or on toilet paper" value="" options="no,yes" propid="91" itemid="10503" categoryid="10494" encounterid="[id number]"Blood in stool  or on toilet paper no. no" label="Change in bowel movements" value="" options="no,yes" propid="91" itemid="199106" categoryid="10494" encounterid="[id number]"Change in bowel movements no. no" label="Constipation" value="" options="no,yes" propid="91" itemid="10501" categoryid="10494" encounterid="[id number]"Constipation no. no"  label="Diarrhea" value="" options="no,yes" propid="91" itemid="10502" categoryid="10494" encounterid="[id number]"Diarrhea no. no" label="Difficulty swallowing" value="" options="no,yes" propid="91" itemid="199104" categoryid="10494" encounterid="[id number]"Difficulty swallowing no. no" label="Nausea" value="" options="no,yes" propid="91" itemid="10499" categoryid="10494" encounterid="[id number]"Nausea no.  FEMALE REPRODUCTIVE no" label="Vulvar pain" value="" options="no,yes" propid="91" itemid="453725" categoryid="10525" encounterid="[id number]"Vulvar pain no. no" label="Vulvar rash" value="" options="no,yes" propid="91" itemid="453726" categoryid="10525" encounterid="[id number]"Vulvar rash no. no" label="Abnormal vaginal bleeding" value="" options="no, yes" propid="91" itemid="444315" categoryid="10525" encounterid="[id number]"Abnormal vaginal bleeding no. no" label="Breast pain" value="" options="no,yes" propid="91" itemid="186083" categoryid="10525" encounterid="[id number]"Breast pain no. no" label="Nipple discharge" value="" options="no,yes" propid="91" itemid="186084" categoryid="10525" encounterid="[id number]"Nipple discharge no. no" label="Pain with intercourse" value="" options="no,yes" propid="91" itemid="275823" categoryid="10525" encounterid="[id number]"Pain with intercourse no. no" label="Pelvic pain" value="" options="no,yes" propid="91" itemid="186082" categoryid="10525" encounterid="[id number]"Pelvic pain no. no" label="Unusual vaginal discharge" value="" options="no,yes" propid="91" itemid="278230" categoryid="10525" encounterid="[id number]"Unusual vaginal discharge no. no" label="Vaginal itching" value="" options="no,yes" propid="91" itemid="278942" categoryid="10525" encounterid="[id number]"Vaginal itching no.  MUSCULOSKELETAL no" label="Muscle aches" value="" options="no,yes" propid="91" itemid="193461" categoryid="10514" encounterid="[id number]"Muscle aches no.  NEUROLOGY no" label="Headache" value=""  options="no,yes" propid="91" itemid="12513" categoryid="12512" encounterid="[id number]"Headache no. no" label="Tingling/numbness" value="" options="no,yes" propid="91" itemid="12514" categoryid="12512" encounterid="[id number]"Tingling/numbness no. no" label="Weakness" value="" options="no,yes" propid="91" itemid="193468" categoryid="12512" encounterid="[id number]"Weakness no.  PSYCHOLOGY no" label="Depression" value="" options="" propid="91" itemid="275919" categoryid="10520" encounterid="[id number]"Depression no. no" label="Anxiety" value="" options="no,yes" propid="91" itemid="172748" categoryid="10520" encounterid="[id number]"Anxiety no. no" label="Nervousness" value="" options="no,yes" propid="91" itemid="199158" categoryid="10520" encounterid="[id number]"Nervousness no. no" label="Sleep disturbances" value="" options="no,yes" propid="91" itemid="12502" categoryid="10520" encounterid="[id number]"Sleep disturbances no. no " label="Suicidal ideation" value="" options="no,yes" propid="91" itemid="72718" categoryid="10520" encounterid="[id number]"Suicidal ideation no .  ENDOCRINOLOGY no" label="Excessive thirst" value="" options="no,yes" propid="91" itemid="194628" categoryid="12508" encounterid="[id number]"Excessive thirst no. no" label="Excessive urination" value="" options="no,yes" propid="91" itemid="196285" categoryid="12508" encounterid="[id number]"Excessive urination no. no" label="Hair loss" value="" options="no, yes" propid="91" itemid="444314" categoryid="12508" encounterid="[id number]"Hair loss no. no" label="Heat or cold intolerance" value="" options="" propid="91" itemid="447284" categoryid="12508" encounterid="[id number]"Heat or cold intolerance no.  HEMATOLOGY/LYMPH no" label="Abnormal bleeding" value="" options="no,yes" propid="91" itemid="199152" categoryid="138157" encounterid="[id number]"Abnormal bleeding no. no" label="Easy bruising" value="" options="no,yes" propid="91" itemid="170653" categoryid="138157"  encounterid="[id number]"Easy bruising no. no" label="Swollen glands" value="" options="no,yes" propid="91" itemid="138158" categoryid="138157" encounterid="[id number]"Swollen glands no.  DERMATOLOGY no" label="New/changing skin lesion" value="" options="no,yes" propid="91" itemid="199126" categoryid="12503" encounterid="[id number]"New/changing skin lesion no. no" label="Rash" value="" options="no,yes" propid="91" itemid="12504" categoryid="12503" encounterid="[id number]"Rash no. no" label="Sores" value="" options="" propid="91" itemid="444313" categoryid="12503" encounterid="[id number]"Sores no.  Negative except as stated in HPI Negative except as stated in HPI.  Objective: Vitals: Wt 242.8, Wt change 13.4 lb, Ht 64.5, BMI 41.03, Pulse sitting 69, BP sitting 126/74.  Past Results: Examination:  General Examination CONSTITUTIONAL: alert, oriented, NAD .  SKIN: moist, warm.  EYES: Conjunctiva clear.  LUNGS: CTA Bilaterally.  regular rate and rhythm" label="HEART:" categoryPropId="86" examid="193638"HEART: regular rate and rhythm.  soft, non-tender/non-distended, bowel sounds present , soft, non-tender/non-distended, bowel sounds present " label="ABDOMEN:" categoryPropId="88" examid="193638"ABDOMEN: soft, non-tender/non-distended, bowel sounds present , soft, non-tender/non-distended, bowel sounds present .  normal external genitalia, labia - unremarkable, vagina - pink moist mucosa, no lesions or abnormal discharge, cervix - not visualized due to displacement of the cervix by fibroids., adnexa - no masses or tenderness, uterus - nontender and 18 wk size" label="FEMALE GENITOURINARY:" categoryPropId="13414" examid="193638"FEMALE GENITOURINARY: normal external genitalia, labia - unremarkable, vagina - pink moist mucosa, no lesions or abnormal discharge, cervix - not visualized due to displacement of the cervix by fibroids., adnexa - no masses or tenderness, uterus - nontender and 18 wk size.  PSYCH: affect  normal, good eye contact.  Physical Examination: Chaperone present Plata,Menda 06/02/2021 12:30:21 PM &gt; , for pelvic exam" label="Chaperone present" itemId="[id number]" categoryId="[id number]"Chaperone present Plata,Menda 06/02/2021 12:30:21 PM > ,  for pelvic exam.  Pt aware of scribe services today.   Assessment: Assessment:  Preoperative examination - Z01.818 (Primary)     Fibroids - D21.9     Plan: Treatment: Preoperative examination Notes: Patient's PMFHx and medications reviewed and reconciled with patient. Discussed risk of surgery with patient including but not limited to infection/bleeding, damage to bowel, bladder, ureters and surrounding organs with the need for further surgery. Discussed risk of blood transfusion. Discussed risk of hiv/hep b&c with blood transfusion. Patient is aware of risks and wishes to proceed with surgery. Pt instructed not to take Ibuprofen 10 days before procedure and not to take Aspirin 10 days prior to procedure, she states understanding. No additional pre-operative clearance needed at this time, no concerning medical Dx that may complicate the procedure. Plan for f/u 2 week post op. . Fibroids Notes: Plan total abdominal hysterectomy with left salpingo-oophorectomy . Marland Kitchen Discussed risk of surgery with patient including but not limited to infection/bleeding, damage to bowel, bladder, ureters and surrounding organs with the need for further surgery. Discussed risk of blood transfusion. Discussed risk of hiv/hep b&c with blood transfusion. Patient is aware of risks and wishes to proceed with surgery. Pt instructed not to take Ibuprofen 10 days before procedure and not to take Aspirin 10 days prior to procedure, she states understanding. No additional pre-operative clearance needed at this time, no concerning medical Dx that may complicate the procedure. Plan for f/u 2 week post op.Marland Kitchen

## 2021-06-13 NOTE — Patient Instructions (Signed)
It was nice to see you again today.  As discussed, your headaches may be due to a combination of factors including stress and mild underlying sleep apnea as well as hormonal fluctuations.  You may improve after your hysterectomy, as discussed, we will also proceed with trial of AutoPap therapy at home.  We will send the order to a local DME company and they should be in touch with you for getting your machine.  Please try to be consistent with using the machine and we will arrange for follow-up, for now, we will make an appointment in about 3 to 4 months for recheck.  We may have to switch the appointment around a little bit depending on when you actually get your AutoPap machine, as you have to be seen within 3 months of starting treatment for sleep apnea at home.   Good luck with your upcoming surgery!

## 2021-06-13 NOTE — H&P (Deleted)
  The note originally documented on this encounter has been moved the the encounter in which it belongs.  

## 2021-06-13 NOTE — Progress Notes (Signed)
Subjective:    Patient ID: Mackenzie Castillo is a 50 y.o. female.  HPI    Interim history:    Mackenzie Castillo is a 50 year old right-handed woman with an underlying medical history of chronic neck pain, diabetes, depression, smoking, obesity with status post gastric bypass in 2005, who presents for follow up consultation of her recurrent headaches. The patient is unaccompanied today.  I first met her on 01/05/2021 at the request of her eye doctor, at which time she reported recent onset of headaches.  Headache description was not telltale for migraine headaches but she did have some light sensitivity.  Tension headache was a possibility and she did report stress as one of her triggers.  She had a recent eye examination and did not have any obvious abnormality on eye exam.  She did have a prior diagnosis of sleep apnea, she stopped using her CPAP after her weight loss surgery.  I suggested we proceed with additional evaluation with a sleep study as well as a brain MRI.  Her insurance denied her brain MRI.  She had a home sleep test from 03/14/2021 showed mild obstructive sleep apnea with a total AHI of 7.4/hour and O2 nadir of 87%.   Today, 06/13/2021: She reports feeling about the same.  She is trying to hydrate well and rest well, sleeps reasonably well when she takes her trazodone and also her Celexa in the evening.  She feels that stress has a lot to do with her headaches.  She does have photophobia with her headaches, no new symptoms, she is getting ready to have a hysterectomy in 2 days from now she has a history of fibroids.  She has looked into an oral appliance for treating her sleep apnea but her insurance would not cover it.  She has reduced her caffeine intake and is working on smoking cessation.  The patient's allergies, current medications, family history, past medical history, past social history, past surgical history and problem list were reviewed and updated as appropriate.   Previously:    01/05/21: (She) reports new onset headaches for the past 8 days, started last week.  She reports a constant aching in the bifrontal area, maybe a little more on the left side.  She has had light sensitivity.  She denies any nausea or vomiting or throbbing headache or one-sided headache or sudden onset of one-sided weakness or numbness or tingling or droopy face or slurring of speech, no prior history of migraines.  She does not typically have nocturnal or morning headaches.  She has not tried anything for the headaches she stated initially but then reported taking ibuprofen as needed, up to 800 mg twice daily.  She has not talked to her primary care physician about the headaches.  She does drink quite a bit of caffeine in the form of coffee, about 2 cups/day and energy drinks about 3/day.  She does not drink any alcohol.  She smokes about a pack per day.  She wants to quit smoking.  In fact, until 2013 she had not smoked for about 10 years but resumed smoking after she lost her dad.  She does not have a family history of migraines.  She has never had a brain scan.  She had a change in her eyeglass prescription but has not picked up her new eyeglasses yet. I reviewed your office note from 01/03/2021, patient had best corrected visual acuity of 20/20 bilaterally.  Extraocular muscles were full and unrestricted.  Confrontation fields were full  in all fields of gaze.  Dilated fundus exam revealed healthy, pink, and distinct disc.  Peripheral retina did not show any signs of retinal holes, tears, breaks or detachment. She has a prior diagnosis of obstructive sleep apnea, sleep study testing was over 15 years ago.  She had a CPAP machine but no longer uses it.  She lost quite a bit of weight after her weight loss surgery, maximum weight before surgery was 342 pounds.   She is currently not working.  She lives with her 2 children, son is 76 and her daughter is 39.  She reports that her daughter has special needs.  She  also reports being separated from her husband.   She goes to bed before midnight and rise time is generally between 6 and 7.  She has nocturia about once per night on average.    Her Past Medical History Is Significant For: Past Medical History:  Diagnosis Date   Anemia    Anxiety    Asthma    Constipation    treat with diet change   Depression    Diabetes mellitus without complication ([place])    type II   Headache    OSA (obstructive sleep apnea)    mild OSA by 03/14/21 HST   Pneumonia 2020   Uterine fibroids     Her Past Surgical History Is Significant For: Past Surgical History:  Procedure Laterality Date   CESAREAN SECTION     x 2   CHOLECYSTECTOMY     GASTRIC BYPASS  2005   LAPAROSCOPY     TOOTH EXTRACTION N/A 05/06/2021   Procedure: DENTAL EXTRACTIONS OF TEETH NUMBER TWO, FIVE, FIFTEEN, TWENTY-NINE, THIRTY;  Surgeon: Diona Browner, DMD;  Location: [facility] OR;  Service: Oral Surgery;  Laterality: N/A;    Her Family History Is Significant For: Family History  Problem Relation Age of Onset   Diabetes Mother    Cancer - Prostate Father    Diabetes Sister    Thyroid disease Sister     Her Social History Is Significant For: Social History   Socioeconomic History   Marital status: Married    Spouse name: Not on file   Number of children: Not on file   Years of education: Not on file   Highest education level: Not on file  Occupational History   Not on file  Tobacco Use   Smoking status: Every Day    Packs/day: 1.00    Years: 10.00    Pack years: 10.00    Types: Cigarettes   Smokeless tobacco: Never  Vaping Use   Vaping Use: Never used  Substance and Sexual Activity   Alcohol use: Yes    Comment: socially   Drug use: Not Currently    Types: Marijuana   Sexual activity: Not on file  Other Topics Concern   Not on file  Social History Narrative   Not on file   Social Determinants of Health   Financial Resource Strain: Not on file  Food Insecurity: Not  on file  Transportation Needs: Not on file  Physical Activity: Not on file  Stress: Not on file  Social Connections: Not on file    Her Allergies Are:  No Known Allergies:   Her Current Medications Are:  Outpatient Encounter Medications as of 06/13/2021  Medication Sig   cetirizine (ZYRTEC) 10 MG tablet Take 10 mg by mouth daily.   citalopram (CELEXA) 40 MG tablet Take 40 mg by mouth at bedtime.   PROAIR  HFA 108 (90 Base) MCG/ACT inhaler Inhale 1 puff into the lungs every 4 (four) hours as needed for wheezing or shortness of breath.   SYMBICORT 160-4.5 MCG/ACT inhaler Inhale 2 puffs into the lungs 2 (two) times daily.   Levomefolate Glucosamine (METHYLFOLATE PO) Take 1 capsule by mouth daily. (Patient not taking: Reported on 06/13/2021)   Melatonin 10 MG TABS Take 10 mg by mouth at bedtime. (Patient not taking: No sig reported)   [DISCONTINUED] amoxicillin (AMOXIL) 500 MG capsule Take 1 capsule (500 mg total) by mouth 3 (three) times daily. (Patient not taking: Reported on 06/13/2021)   [DISCONTINUED] HYDROcodone-acetaminophen (NORCO) 5-325 MG tablet Take 1 tablet by mouth every 6 (six) hours as needed for moderate pain. (Patient not taking: Reported on 06/13/2021)   [DISCONTINUED] traZODone (DESYREL) 100 MG tablet Take 100 mg by mouth at bedtime as needed for sleep. (Patient not taking: Reported on 06/13/2021)   No facility-administered encounter medications on file as of 06/13/2021.  :  Review of Systems:  Out of a complete 14 point review of systems, all are reviewed and negative with the exception of these symptoms as listed below:  Review of Systems  Neurological:        Here for f/u on h/a. Reports sx are the same as before.   Objective:  Neurological Exam  Physical Exam Physical Examination:   Vitals:   06/13/21 0942  BP: 122/70  Pulse: 74  SpO2: 96%    General Examination: The patient is a very pleasant 50 y.o. female in no acute distress. She appears well-developed  and well-nourished and well groomed.   HEENT: Normocephalic, atraumatic, pupils are equal, round and reactive to light, no significant photophobia at this time. Extraocular tracking is good without limitation to gaze excursion or nystagmus noted. Normal smooth pursuit is noted. Hearing is grossly intact. Face is symmetric with normal facial animation. Speech is clear with no dysarthria noted. There is no hypophonia. There is no lip, neck/head, jaw or voice tremor. Neck is supple with full range of passive and active motion. There are no carotid bruits on auscultation. Oropharynx exam reveals: mild mouth dryness, adequate dental hygiene and moderate airway crowding.  Tongue protrudes centrally and palate elevates symmetrically.   Chest: Clear to auscultation without wheezing, rhonchi or crackles noted.   Heart: S1+S2+0, regular and normal without murmurs, rubs or gallops noted.   Abdomen: Soft, non-tender and non-distended.   Extremities: There is no pitting edema in the distal lower extremities bilaterally.   Skin: Warm and dry without trophic changes noted.   Musculoskeletal: exam reveals no obvious joint deformities.   Neurologically: Mental status: The patient is awake, alert and oriented in all 4 spheres. Her immediate and remote memory, attention, language skills and fund of knowledge are appropriate. There is no evidence of aphasia, agnosia, apraxia or anomia. Speech is clear with normal prosody and enunciation. Thought process is linear. Mood is normal and affect is normal. Cranial nerves II - XII are as described above under HEENT exam.  Motor exam: Normal bulk, strength and tone is noted. There is no obvious tremor.   Fine motor skills and coordination: grossly intact in the upper and lower extremities.   Cerebellar testing: No dysmetria or intention tremor. There is no truncal or gait ataxia. Sensory exam: intact to light touch in the upper and lower extremities. Gait, station and  balance: She stands without difficulty. Posture is age-appropriate and walks without problem, no problems turning.     Assessment  and Plan:    In summary, MAZY CULTON is a very pleasant 50 year old female  with an underlying medical history of chronic neck pain, diabetes, depression, smoking, obesity with status post gastric bypass in 2005, who presents for presents for follow-up consultation of her recurrent headaches of several months duration.  She has a nonfocal exam, insurance denied a brain MRI.  She had an updated eye examination.  Home sleep test in March 2022 showed evidence of mild obstructive sleep apnea.  She previously was on CPAP therapy and looked into the possibility of an oral appliance.  Her insurance would not cover an oral appliance, unfortunately.  We talked about her headache triggers again today.  She is getting ready to have a hysterectomy in 2 days.  We mutually agreed not to start any new medications at this time.  She is willing to consider AutoPap therapy at this point.  To that end, I will refer her to a DME company for getting set up with AutoPap at home.  She is advised to follow-up routinely in about 3 to 4 months, hopefully, hysterectomy may have a positive impact on her headaches if there is a hormonal component to her headaches.  She is advised to call us with any questions or concerns, once she starts AutoPap therapy we should see her within 31 to 89 days of starting treatment.  She is encouraged to be consistent with her AutoPap trial.  Down the road, we can consider a headache preventative as well if need be.  I answered all her questions today and she was in agreement with the plan. I spent 30 minutes in total face-to-face time and in reviewing records during pre-charting, more than 50% of which was spent in counseling and coordination of care, reviewing test results, reviewing medications and treatment regimen and/or in discussing or reviewing the diagnosis of rec.  HAs, the prognosis and treatment options. Pertinent laboratory and imaging test results that were available during this visit with the patient were reviewed by me and considered in my medical decision making (see chart for details).

## 2021-06-15 ENCOUNTER — Encounter (HOSPITAL_COMMUNITY): Payer: Self-pay | Admitting: Obstetrics and Gynecology

## 2021-06-15 ENCOUNTER — Inpatient Hospital Stay (HOSPITAL_COMMUNITY): Payer: Medicaid Other | Admitting: Vascular Surgery

## 2021-06-15 ENCOUNTER — Inpatient Hospital Stay (HOSPITAL_COMMUNITY): Payer: Medicaid Other | Admitting: Anesthesiology

## 2021-06-15 ENCOUNTER — Encounter (HOSPITAL_COMMUNITY)
Admission: RE | Disposition: A | Discharge status: Home / Self Care | Payer: Self-pay | Source: Home / Self Care | Attending: Obstetrics and Gynecology

## 2021-06-15 ENCOUNTER — Inpatient Hospital Stay (HOSPITAL_COMMUNITY)
Admission: RE | Admit: 2021-06-15 | Discharge: 2021-06-17 | DRG: 742 | Disposition: A | Discharge status: Home / Self Care | Payer: Medicaid Other | Attending: Obstetrics and Gynecology | Admitting: Obstetrics and Gynecology

## 2021-06-15 DIAGNOSIS — N951 Menopausal and female climacteric states: Secondary | ICD-10-CM | POA: Diagnosis present

## 2021-06-15 DIAGNOSIS — Y768 Miscellaneous obstetric and gynecological devices associated with adverse incidents, not elsewhere classified: Secondary | ICD-10-CM | POA: Diagnosis not present

## 2021-06-15 DIAGNOSIS — Z7951 Long term (current) use of inhaled steroids: Secondary | ICD-10-CM

## 2021-06-15 DIAGNOSIS — D219 Benign neoplasm of connective and other soft tissue, unspecified: Secondary | ICD-10-CM | POA: Diagnosis present

## 2021-06-15 DIAGNOSIS — Z7984 Long term (current) use of oral hypoglycemic drugs: Secondary | ICD-10-CM | POA: Diagnosis not present

## 2021-06-15 DIAGNOSIS — M797 Fibromyalgia: Secondary | ICD-10-CM | POA: Diagnosis present

## 2021-06-15 DIAGNOSIS — F17210 Nicotine dependence, cigarettes, uncomplicated: Secondary | ICD-10-CM | POA: Diagnosis present

## 2021-06-15 DIAGNOSIS — G4700 Insomnia, unspecified: Secondary | ICD-10-CM | POA: Diagnosis present

## 2021-06-15 DIAGNOSIS — Z9884 Bariatric surgery status: Secondary | ICD-10-CM

## 2021-06-15 DIAGNOSIS — J45909 Unspecified asthma, uncomplicated: Secondary | ICD-10-CM | POA: Diagnosis present

## 2021-06-15 DIAGNOSIS — D259 Leiomyoma of uterus, unspecified: Principal | ICD-10-CM | POA: Diagnosis present

## 2021-06-15 DIAGNOSIS — Z833 Family history of diabetes mellitus: Secondary | ICD-10-CM | POA: Diagnosis not present

## 2021-06-15 DIAGNOSIS — G4733 Obstructive sleep apnea (adult) (pediatric): Secondary | ICD-10-CM | POA: Diagnosis present

## 2021-06-15 DIAGNOSIS — Z560 Unemployment, unspecified: Secondary | ICD-10-CM | POA: Diagnosis not present

## 2021-06-15 DIAGNOSIS — E119 Type 2 diabetes mellitus without complications: Secondary | ICD-10-CM | POA: Diagnosis present

## 2021-06-15 DIAGNOSIS — D62 Acute posthemorrhagic anemia: Secondary | ICD-10-CM | POA: Diagnosis not present

## 2021-06-15 DIAGNOSIS — Z90710 Acquired absence of both cervix and uterus: Secondary | ICD-10-CM | POA: Diagnosis present

## 2021-06-15 DIAGNOSIS — N9972 Accidental puncture and laceration of a genitourinary system organ or structure during other procedure: Secondary | ICD-10-CM | POA: Diagnosis not present

## 2021-06-15 DIAGNOSIS — Z79899 Other long term (current) drug therapy: Secondary | ICD-10-CM | POA: Diagnosis not present

## 2021-06-15 DIAGNOSIS — Z6841 Body Mass Index (BMI) 40.0 and over, adult: Secondary | ICD-10-CM

## 2021-06-15 HISTORY — PX: HYSTERECTOMY ABDOMINAL WITH SALPINGECTOMY: SHX6725

## 2021-06-15 HISTORY — PX: BLADDER REPAIR: SHX6721

## 2021-06-15 LAB — GLUCOSE, CAPILLARY
Glucose-Capillary: 142 mg/dL — ABNORMAL HIGH (ref 70–99)
Glucose-Capillary: 214 mg/dL — ABNORMAL HIGH (ref 70–99)

## 2021-06-15 LAB — PREPARE RBC (CROSSMATCH)

## 2021-06-15 LAB — ABO/RH: ABO/RH(D): O POS

## 2021-06-15 SURGERY — HYSTERECTOMY, TOTAL, ABDOMINAL, WITH SALPINGECTOMY
Anesthesia: General

## 2021-06-15 MED ORDER — ROCURONIUM BROMIDE 10 MG/ML (PF) SYRINGE
PREFILLED_SYRINGE | INTRAVENOUS | Status: AC
  Filled 2021-06-15: qty 10

## 2021-06-15 MED ORDER — PHENYLEPHRINE 40 MCG/ML (10ML) SYRINGE FOR IV PUSH (FOR BLOOD PRESSURE SUPPORT)
PREFILLED_SYRINGE | INTRAVENOUS | Status: AC
  Filled 2021-06-15: qty 10

## 2021-06-15 MED ORDER — EPHEDRINE 5 MG/ML INJ
INTRAVENOUS | Status: AC
  Filled 2021-06-15: qty 10

## 2021-06-15 MED ORDER — ACETAMINOPHEN 500 MG PO TABS
1000.0000 mg | ORAL_TABLET | Freq: Four times a day (QID) | ORAL | Status: DC
  Administered 2021-06-15 – 2021-06-17 (×8): 1000 mg via ORAL
  Filled 2021-06-15 (×9): qty 2

## 2021-06-15 MED ORDER — MIDAZOLAM HCL 5 MG/5ML IJ SOLN
INTRAMUSCULAR | Status: DC | PRN
  Administered 2021-06-15 (×2): 1 mg via INTRAVENOUS

## 2021-06-15 MED ORDER — ONDANSETRON HCL 4 MG/2ML IJ SOLN
INTRAMUSCULAR | Status: DC | PRN
  Administered 2021-06-15: 4 mg via INTRAVENOUS

## 2021-06-15 MED ORDER — HYDROMORPHONE HCL 1 MG/ML IJ SOLN: 0.2000 mg | INTRAMUSCULAR | Status: DC | PRN

## 2021-06-15 MED ORDER — POVIDONE-IODINE 10 % EX SWAB: 2.0000 "application " | Freq: Once | CUTANEOUS | Status: DC

## 2021-06-15 MED ORDER — PANTOPRAZOLE SODIUM 40 MG PO TBEC
40.0000 mg | DELAYED_RELEASE_TABLET | Freq: Every day | ORAL | Status: DC
  Administered 2021-06-15 – 2021-06-17 (×3): 40 mg via ORAL
  Filled 2021-06-15 (×3): qty 1

## 2021-06-15 MED ORDER — SODIUM CHLORIDE 0.9 % IV SOLN: 10.0000 mL/h | Freq: Once | INTRAVENOUS | Status: DC

## 2021-06-15 MED ORDER — HYDROMORPHONE HCL 1 MG/ML IJ SOLN
INTRAMUSCULAR | Status: AC
  Filled 2021-06-15: qty 0.5

## 2021-06-15 MED ORDER — SODIUM CHLORIDE 0.9 % IV SOLN
3.0000 g | Freq: Four times a day (QID) | INTRAVENOUS | Status: DC
  Administered 2021-06-15 – 2021-06-16 (×2): 3 g via INTRAVENOUS
  Filled 2021-06-15: qty 3
  Filled 2021-06-15 (×3): qty 8
  Filled 2021-06-15: qty 3

## 2021-06-15 MED ORDER — CHLORHEXIDINE GLUCONATE 0.12 % MT SOLN
15.0000 mL | Freq: Once | OROMUCOSAL | Status: AC
  Administered 2021-06-15: 15 mL via OROMUCOSAL
  Filled 2021-06-15: qty 15

## 2021-06-15 MED ORDER — PROMETHAZINE HCL 25 MG/ML IJ SOLN: 6.2500 mg | INTRAMUSCULAR | Status: DC | PRN

## 2021-06-15 MED ORDER — PHENYLEPHRINE HCL (PRESSORS) 10 MG/ML IV SOLN
INTRAVENOUS | Status: DC | PRN
  Administered 2021-06-15: 80 ug via INTRAVENOUS
  Administered 2021-06-15: 40 ug via INTRAVENOUS
  Administered 2021-06-15 (×2): 80 ug via INTRAVENOUS
  Administered 2021-06-15: 40 ug via INTRAVENOUS
  Administered 2021-06-15: 120 ug via INTRAVENOUS
  Administered 2021-06-15: 80 ug via INTRAVENOUS

## 2021-06-15 MED ORDER — LIDOCAINE 2% (20 MG/ML) 5 ML SYRINGE
INTRAMUSCULAR | Status: AC
  Filled 2021-06-15: qty 5

## 2021-06-15 MED ORDER — ALBUTEROL SULFATE HFA 108 (90 BASE) MCG/ACT IN AERS
INHALATION_SPRAY | RESPIRATORY_TRACT | Status: AC
  Filled 2021-06-15: qty 6.7

## 2021-06-15 MED ORDER — CEFAZOLIN SODIUM-DEXTROSE 2-4 GM/100ML-% IV SOLN
INTRAVENOUS | Status: AC
  Filled 2021-06-15: qty 100

## 2021-06-15 MED ORDER — FENTANYL CITRATE (PF) 250 MCG/5ML IJ SOLN
INTRAMUSCULAR | Status: DC | PRN
  Administered 2021-06-15 (×2): 50 ug via INTRAVENOUS
  Administered 2021-06-15: 100 ug via INTRAVENOUS
  Administered 2021-06-15: 50 ug via INTRAVENOUS

## 2021-06-15 MED ORDER — PROPOFOL 10 MG/ML IV BOLUS
INTRAVENOUS | Status: AC
  Filled 2021-06-15: qty 40

## 2021-06-15 MED ORDER — HYDROMORPHONE HCL 1 MG/ML IJ SOLN
0.2500 mg | INTRAMUSCULAR | Status: DC | PRN
  Administered 2021-06-15 (×2): 0.5 mg via INTRAVENOUS

## 2021-06-15 MED ORDER — EPHEDRINE SULFATE-NACL 50-0.9 MG/10ML-% IV SOSY
PREFILLED_SYRINGE | INTRAVENOUS | Status: DC | PRN
  Administered 2021-06-15 (×3): 10 mg via INTRAVENOUS

## 2021-06-15 MED ORDER — CITALOPRAM HYDROBROMIDE 40 MG PO TABS
40.0000 mg | ORAL_TABLET | Freq: Every day | ORAL | Status: DC
  Administered 2021-06-15 – 2021-06-16 (×2): 40 mg via ORAL
  Filled 2021-06-15 (×2): qty 1

## 2021-06-15 MED ORDER — HYDROMORPHONE HCL 1 MG/ML IJ SOLN
INTRAMUSCULAR | Status: DC | PRN
  Administered 2021-06-15 (×3): .5 mg via INTRAVENOUS

## 2021-06-15 MED ORDER — SODIUM CHLORIDE 0.9 % IV SOLN
2.0000 g | INTRAVENOUS | Status: DC
  Filled 2021-06-15: qty 2

## 2021-06-15 MED ORDER — VASOPRESSIN 20 UNIT/ML IV SOLN
INTRAVENOUS | Status: AC
  Filled 2021-06-15: qty 1

## 2021-06-15 MED ORDER — SODIUM CHLORIDE 0.9 % IV SOLN: 2.0000 g | INTRAVENOUS | Status: DC

## 2021-06-15 MED ORDER — ONDANSETRON HCL 4 MG/2ML IJ SOLN: 4.0000 mg | Freq: Four times a day (QID) | INTRAMUSCULAR | Status: DC | PRN

## 2021-06-15 MED ORDER — ACETAMINOPHEN 500 MG PO TABS
1000.0000 mg | ORAL_TABLET | ORAL | Status: AC
  Administered 2021-06-15: 1000 mg via ORAL
  Filled 2021-06-15: qty 2

## 2021-06-15 MED ORDER — KETOROLAC TROMETHAMINE 30 MG/ML IJ SOLN
INTRAMUSCULAR | Status: AC
  Filled 2021-06-15: qty 1

## 2021-06-15 MED ORDER — ALBUMIN HUMAN 5 % IV SOLN: INTRAVENOUS | Status: DC | PRN

## 2021-06-15 MED ORDER — LACTATED RINGERS IV SOLN: INTRAVENOUS | Status: DC

## 2021-06-15 MED ORDER — SENNA 8.6 MG PO TABS
1.0000 | ORAL_TABLET | Freq: Two times a day (BID) | ORAL | Status: DC
  Administered 2021-06-15 – 2021-06-17 (×4): 8.6 mg via ORAL
  Filled 2021-06-15 (×4): qty 1

## 2021-06-15 MED ORDER — ONDANSETRON HCL 4 MG/2ML IJ SOLN
INTRAMUSCULAR | Status: AC
  Filled 2021-06-15: qty 2

## 2021-06-15 MED ORDER — DEXAMETHASONE SODIUM PHOSPHATE 10 MG/ML IJ SOLN
INTRAMUSCULAR | Status: DC | PRN
  Administered 2021-06-15: 5 mg via INTRAVENOUS

## 2021-06-15 MED ORDER — OXYCODONE HCL 5 MG PO TABS
5.0000 mg | ORAL_TABLET | ORAL | Status: DC | PRN
  Administered 2021-06-16 (×2): 5 mg via ORAL
  Filled 2021-06-15 (×2): qty 1

## 2021-06-15 MED ORDER — FENTANYL CITRATE (PF) 250 MCG/5ML IJ SOLN
INTRAMUSCULAR | Status: AC
  Filled 2021-06-15: qty 5

## 2021-06-15 MED ORDER — DEXAMETHASONE SODIUM PHOSPHATE 10 MG/ML IJ SOLN
INTRAMUSCULAR | Status: AC
  Filled 2021-06-15: qty 1

## 2021-06-15 MED ORDER — LIDOCAINE 2% (20 MG/ML) 5 ML SYRINGE
INTRAMUSCULAR | Status: DC | PRN
  Administered 2021-06-15: 60 mg via INTRAVENOUS

## 2021-06-15 MED ORDER — CEFAZOLIN SODIUM-DEXTROSE 2-4 GM/100ML-% IV SOLN
2.0000 g | Freq: Once | INTRAVENOUS | Status: AC
  Administered 2021-06-15 (×2): 2 g via INTRAVENOUS

## 2021-06-15 MED ORDER — SODIUM CHLORIDE 0.9 % IV SOLN: INTRAVENOUS | Status: DC | PRN

## 2021-06-15 MED ORDER — SODIUM CHLORIDE (PF) 0.9 % IJ SOLN
INTRAMUSCULAR | Status: AC
  Filled 2021-06-15: qty 50

## 2021-06-15 MED ORDER — ORAL CARE MOUTH RINSE: 15.0000 mL | Freq: Once | OROMUCOSAL | Status: AC

## 2021-06-15 MED ORDER — MENTHOL 3 MG MT LOZG: 1.0000 | LOZENGE | OROMUCOSAL | Status: DC | PRN

## 2021-06-15 MED ORDER — ONDANSETRON HCL 4 MG PO TABS: 4.0000 mg | ORAL_TABLET | Freq: Four times a day (QID) | ORAL | Status: DC | PRN

## 2021-06-15 MED ORDER — KETOROLAC TROMETHAMINE 30 MG/ML IJ SOLN
30.0000 mg | Freq: Once | INTRAMUSCULAR | Status: AC
  Administered 2021-06-15: 30 mg via INTRAVENOUS

## 2021-06-15 MED ORDER — LORATADINE 10 MG PO TABS
10.0000 mg | ORAL_TABLET | Freq: Every day | ORAL | Status: DC
  Administered 2021-06-16 – 2021-06-17 (×2): 10 mg via ORAL
  Filled 2021-06-15 (×2): qty 1

## 2021-06-15 MED ORDER — ZOLPIDEM TARTRATE 5 MG PO TABS
5.0000 mg | ORAL_TABLET | Freq: Every evening | ORAL | Status: DC | PRN
  Administered 2021-06-16: 5 mg via ORAL
  Filled 2021-06-15: qty 1

## 2021-06-15 MED ORDER — OXYCODONE HCL 5 MG PO TABS: 5.0000 mg | ORAL_TABLET | Freq: Once | ORAL | Status: DC | PRN

## 2021-06-15 MED ORDER — SIMETHICONE 80 MG PO CHEW: 80.0000 mg | CHEWABLE_TABLET | Freq: Four times a day (QID) | ORAL | Status: DC | PRN

## 2021-06-15 MED ORDER — ROCURONIUM BROMIDE 100 MG/10ML IV SOLN
INTRAVENOUS | Status: DC | PRN
  Administered 2021-06-15 (×2): 20 mg via INTRAVENOUS
  Administered 2021-06-15: 100 mg via INTRAVENOUS
  Administered 2021-06-15 (×3): 20 mg via INTRAVENOUS

## 2021-06-15 MED ORDER — SUGAMMADEX SODIUM 500 MG/5ML IV SOLN
INTRAVENOUS | Status: DC | PRN
  Administered 2021-06-15: 300 mg via INTRAVENOUS

## 2021-06-15 MED ORDER — KETOROLAC TROMETHAMINE 30 MG/ML IJ SOLN
30.0000 mg | Freq: Four times a day (QID) | INTRAMUSCULAR | Status: AC
  Administered 2021-06-15 – 2021-06-16 (×4): 30 mg via INTRAVENOUS
  Filled 2021-06-15 (×4): qty 1

## 2021-06-15 MED ORDER — AMISULPRIDE (ANTIEMETIC) 5 MG/2ML IV SOLN: 10.0000 mg | Freq: Once | INTRAVENOUS | Status: DC | PRN

## 2021-06-15 MED ORDER — ALBUTEROL SULFATE (2.5 MG/3ML) 0.083% IN NEBU: 3.0000 mL | INHALATION_SOLUTION | RESPIRATORY_TRACT | Status: DC | PRN

## 2021-06-15 MED ORDER — OXYCODONE HCL 5 MG/5ML PO SOLN: 5.0000 mg | Freq: Once | ORAL | Status: DC | PRN

## 2021-06-15 MED ORDER — PROPOFOL 10 MG/ML IV BOLUS
INTRAVENOUS | Status: DC | PRN
  Administered 2021-06-15: 150 mg via INTRAVENOUS

## 2021-06-15 MED ORDER — IBUPROFEN 600 MG PO TABS
600.0000 mg | ORAL_TABLET | Freq: Four times a day (QID) | ORAL | Status: DC
  Administered 2021-06-16 – 2021-06-17 (×3): 600 mg via ORAL
  Filled 2021-06-15 (×3): qty 1

## 2021-06-15 MED ORDER — MIDAZOLAM HCL 2 MG/2ML IJ SOLN
INTRAMUSCULAR | Status: AC
  Filled 2021-06-15: qty 2

## 2021-06-15 MED ORDER — CELECOXIB 200 MG PO CAPS
400.0000 mg | ORAL_CAPSULE | ORAL | Status: AC
  Administered 2021-06-15: 400 mg via ORAL
  Filled 2021-06-15: qty 2

## 2021-06-15 MED ORDER — PHENYLEPHRINE HCL-NACL 10-0.9 MG/250ML-% IV SOLN
INTRAVENOUS | Status: DC | PRN
  Administered 2021-06-15: 20 ug/min via INTRAVENOUS

## 2021-06-15 MED ORDER — HYDROMORPHONE HCL 1 MG/ML IJ SOLN
INTRAMUSCULAR | Status: AC
  Filled 2021-06-15: qty 1

## 2021-06-15 MED ORDER — ALUM & MAG HYDROXIDE-SIMETH 200-200-20 MG/5ML PO SUSP: 30.0000 mL | ORAL | Status: DC | PRN

## 2021-06-15 MED ORDER — MOMETASONE FURO-FORMOTEROL FUM 200-5 MCG/ACT IN AERO
2.0000 | INHALATION_SPRAY | Freq: Two times a day (BID) | RESPIRATORY_TRACT | Status: DC
  Administered 2021-06-15 – 2021-06-17 (×4): 2 via RESPIRATORY_TRACT
  Filled 2021-06-15: qty 8.8

## 2021-06-15 MED ORDER — VASOPRESSIN 20 UNIT/ML IV SOLN
INTRAVENOUS | Status: DC | PRN
  Administered 2021-06-15: 93 mL via INTRAMUSCULAR

## 2021-06-15 MED ORDER — ALBUTEROL SULFATE HFA 108 (90 BASE) MCG/ACT IN AERS
INHALATION_SPRAY | RESPIRATORY_TRACT | Status: DC | PRN
  Administered 2021-06-15 (×2): 2 via RESPIRATORY_TRACT

## 2021-06-15 SURGICAL SUPPLY — 50 items
APL SKNCLS STERI-STRIP NONHPOA (GAUZE/BANDAGES/DRESSINGS) ×2
BENZOIN TINCTURE PRP APPL 2/3 (GAUZE/BANDAGES/DRESSINGS) ×1 IMPLANT
CATH INTERMIT  6FR 70CM (CATHETERS) ×1 IMPLANT
CATH URET 5FR 28IN OPEN ENDED (CATHETERS) ×2 IMPLANT
COVER WAND RF STERILE (DRAPES) ×3 IMPLANT
DRAIN JACKSON PRT FLT 10 (DRAIN) ×1 IMPLANT
DRAPE CESAREAN BIRTH W POUCH (DRAPES) ×1 IMPLANT
DRAPE WARM FLUID 44X44 (DRAPES) ×1 IMPLANT
DRSG OPSITE POSTOP 4X10 (GAUZE/BANDAGES/DRESSINGS) ×3 IMPLANT
DURAPREP 26ML APPLICATOR (WOUND CARE) ×3 IMPLANT
EVACUATOR SILICONE 100CC (DRAIN) ×1 IMPLANT
GAUZE 4X4 16PLY RFD (DISPOSABLE) ×3 IMPLANT
GLOVE SURG ENC TEXT LTX SZ6.5 (GLOVE) ×3 IMPLANT
GLOVE SURG UNDER POLY LF SZ6.5 (GLOVE) ×3 IMPLANT
GLOVE SURG UNDER POLY LF SZ7 (GLOVE) ×6 IMPLANT
GOWN STRL REUS W/ TWL LRG LVL3 (GOWN DISPOSABLE) ×8 IMPLANT
GOWN STRL REUS W/TWL LRG LVL3 (GOWN DISPOSABLE) ×12
GUIDEWIRE ZIPWRE .038 STRAIGHT (WIRE) ×1 IMPLANT
HEMOSTAT ARISTA ABSORB 3G PWDR (HEMOSTASIS) IMPLANT
HIBICLENS CHG 4% 4OZ BTL (MISCELLANEOUS) ×3 IMPLANT
KIT TURNOVER KIT B (KITS) ×3 IMPLANT
NEEDLE HYPO 22GX1.5 SAFETY (NEEDLE) ×1 IMPLANT
NS IRRIG 1000ML POUR BTL (IV SOLUTION) ×4 IMPLANT
PACK ABDOMINAL GYN (CUSTOM PROCEDURE TRAY) ×3 IMPLANT
PAD ARMBOARD 7.5X6 YLW CONV (MISCELLANEOUS) ×3 IMPLANT
PAD OB MATERNITY 4.3X12.25 (PERSONAL CARE ITEMS) ×3 IMPLANT
RETAINER VISCERA MED (MISCELLANEOUS) ×1 IMPLANT
SPECIMEN JAR MEDIUM (MISCELLANEOUS) ×3 IMPLANT
SPONGE LAP 18X18 RF (DISPOSABLE) ×9 IMPLANT
STRIP CLOSURE SKIN 1/2X4 (GAUZE/BANDAGES/DRESSINGS) ×1 IMPLANT
SUT ETHILON 3 0 FSL (SUTURE) ×1 IMPLANT
SUT PDS AB 0 CT1 27 (SUTURE) ×7 IMPLANT
SUT PLAIN 2 0 XLH (SUTURE) ×1 IMPLANT
SUT VIC AB 0 CT1 27 (SUTURE) ×6
SUT VIC AB 0 CT1 27XCR 8 STRN (SUTURE) ×4 IMPLANT
SUT VIC AB 0 CT1 36 (SUTURE) ×3 IMPLANT
SUT VIC AB 2-0 CT1 (SUTURE) IMPLANT
SUT VIC AB 2-0 CT1 27 (SUTURE) ×3
SUT VIC AB 2-0 CT1 TAPERPNT 27 (SUTURE) ×2 IMPLANT
SUT VIC AB 2-0 SH 27 (SUTURE) ×18
SUT VIC AB 2-0 SH 27XBRD (SUTURE) ×2 IMPLANT
SUT VIC AB 3-0 SH 27 (SUTURE) ×15
SUT VIC AB 3-0 SH 27X BRD (SUTURE) IMPLANT
SUT VIC AB 4-0 KS 27 (SUTURE) ×1 IMPLANT
SUT VICRYL 0 TIES 12 18 (SUTURE) ×3 IMPLANT
SYR CONTROL 10ML LL (SYRINGE) ×1 IMPLANT
SYR HYPO PISTON GT 60 (SYRINGE) ×1 IMPLANT
TOWEL GREEN STERILE FF (TOWEL DISPOSABLE) ×5 IMPLANT
TRAY FOLEY MTR SLVR 16FR STAT (SET/KITS/TRAYS/PACK) ×1 IMPLANT
TRAY FOLEY W/BAG SLVR 14FR (SET/KITS/TRAYS/PACK) ×3 IMPLANT

## 2021-06-15 NOTE — Op Note (Signed)
Preoperative diagnosis: Bladder injury with cystotomy Postoperative diagnosis, bladder injury with cystotomy Surgery: Repair of bladder cystotomy patient has Diagnosis Surgeon: Dr. Nicki Reaper Helvi Royals abscess Assistant: Dr. Phebe Colla  I was consulted to come to the operating room because of a bladder injury during hysterectomy.  The case was reviewed in detail including history and the patient had an enormous fibroid that was partially removed.  It was half the size of a football and there was still a fibroid and the uterus not removed.  It was difficult to distinguish if the patient actually had a cervix and the bladder was adhered to the residual fibroid wall.  It was adhered for multiple centimeters for a number of inches.  There was a large hole in the bladder and I could feel bladder lumen extending to the patient's right side.  On the left side the bladder wall was much thinner and there it appeared to extend more cephalad.  It was difficult to sort out the anatomy and it looked like the left ureter was identified.  It turned out not to be the left ureter.  Staying mainly in the midline I dissected the bladder keeping as much bladder muscle as possible off the fibroid for several cm.  It was mobilized off for approximately 8 cm.  I called for Dr Tresa Moore to assist because of the altered anatomy and the complexity of the injury.  At that stage we could not see the trigone. The opening in the bladder was likely 10 cm.  We switched retractors.  She had a very wide Pfannenstiel incision that was adequate.  The fascia in the midline inferiorly was incised as well as the rectus muscles were split down to the symphysis pubis to give total exposure to the bladder relatively the space of Retzius.   Appropriate retractors and Babcocks were used to expose the trigone.  We thought we could see clear urine efflux from the ureters.  We passed a sensor wire easily full length up the ureter followed by a 6 [place]  open-ended ureteral catheter on the right and a 5 [place] on the left.  It was sutured in place with 3-0 Vicryl interrupted suture to the bladder mucosa.  They went in very nicely.  We could feel the stented ureters deep in the retroperitoneum away from the uterus and injured bladder.  The plane between the uterus and cervix and the bladder was fully developed.  The gynecology team removed the uterus.  We once again inspected the ureters externally.  They were traced and not injured.We were confident that they were not injured.  Both ureteral catheters were removed.  Using Babcock clamps it was easy to identify the extent of cystotomy more so on the left than the right.  Again the cystotomy was near the dome and away from the trigone many cms.  she had a large capacity bladder.   The bladder was closed with running 2-0 Vicryl from left apex to the right and from the right apex to the other side. Sutures were tied at the midpoint of the cystotomy. It was a full-thickness closure.  A second layer of 2-0 Vicryl on an SH as a seromuscular second layer was performed. The tissues were healthy and the repair looked robust  We instilled 200 cc of sterile water into the bladder with a catheter tip syringe and there was no leak.  A #10 drain was brought thru an incision in the left lower quadrant and pelvis. The drain was placed  in the pelvis next to the bladder but away  from the suture line.  Hemostasis was good.  Catheter was draining well at end of the case.

## 2021-06-15 NOTE — Anesthesia Postprocedure Evaluation (Signed)
Anesthesia Post Note  Patient: Mackenzie Castillo  Procedure(s) Performed: TOTAL ABDOMINAL HYSTERECTOMY WITH LEFT SALPINGO-OOPHERECTOMY. APPLICATION OF CELL SAVER CLOSURE CYSTOTOMY     Patient location during evaluation: PACU Anesthesia Type: General Level of consciousness: awake and alert, oriented and patient cooperative Pain management: pain level controlled Vital Signs Assessment: post-procedure vital signs reviewed and stable Respiratory status: spontaneous breathing, nonlabored ventilation and respiratory function stable Cardiovascular status: blood pressure returned to baseline and stable Postop Assessment: no apparent nausea or vomiting Anesthetic complications: no   No notable events documented.  Last Vitals:  Vitals:   06/15/21 1635 06/15/21 1640  BP: 119/72   Pulse: 85 86  Resp: 13 14  Temp:    SpO2: 100% 99%    Last Pain:  Vitals:   06/15/21 1620  PainSc: 0-No pain                 Jarome Matin Addilynne Olheiser

## 2021-06-15 NOTE — Transfer of Care (Signed)
Immediate Anesthesia Transfer of Care Note  Patient: Mackenzie Castillo  Procedure(s) Performed: TOTAL ABDOMINAL HYSTERECTOMY WITH LEFT SALPINGO-OOPHERECTOMY. APPLICATION OF CELL SAVER CLOSURE CYSTOTOMY  Patient Location: PACU  Anesthesia Type:General  Level of Consciousness: awake, alert  and oriented  Airway & Oxygen Therapy: Patient Spontanous Breathing and Patient connected to nasal cannula oxygen  Post-op Assessment: Report given to RN and Post -op Vital signs reviewed and stable  Post vital signs: Reviewed and stable  Last Vitals:  Vitals Value Taken Time  BP 122/67 06/15/21 1550  Temp    Pulse 88 06/15/21 1552  Resp 23 06/15/21 1552  SpO2 95 % 06/15/21 1552  Vitals shown include unvalidated device data.  Last Pain:  Vitals:   06/15/21 0803  PainSc: 0-No pain      Patients Stated Pain Goal: 2 (26/71/24 5809)  Complications: No notable events documented.

## 2021-06-15 NOTE — Anesthesia Procedure Notes (Signed)
Procedure Name: Intubation Date/Time: 06/15/2021 10:38 AM Performed by: Ignacia Bayley, CRNA Pre-anesthesia Checklist: Patient identified, Patient being monitored, Timeout performed, Emergency Drugs available and Suction available Patient Re-evaluated:Patient Re-evaluated prior to induction Oxygen Delivery Method: Circle System Utilized Preoxygenation: Pre-oxygenation with 100% oxygen Induction Type: IV induction Ventilation: Mask ventilation without difficulty Laryngoscope Size: Miller and 2 Grade View: Grade II Tube type: Oral Tube size: 7.0 mm Number of attempts: 1 Airway Equipment and Method: stylet Placement Confirmation: ETT inserted through vocal cords under direct vision, positive ETCO2 and breath sounds checked- equal and bilateral Secured at: 21 cm Tube secured with: Tape Dental Injury: Teeth and Oropharynx as per pre-operative assessment

## 2021-06-15 NOTE — Op Note (Signed)
06/15/2021  4:30 PM  PATIENT:  Mackenzie Castillo  50 y.o. female  PRE-OPERATIVE DIAGNOSIS:  Fibroids (D25.9)  POST-OPERATIVE DIAGNOSIS:  Fibroids (D25.9)  PROCEDURE:  Procedure(s): TOTAL ABDOMINAL HYSTERECTOMY WITH LEFT SALPINGO-OOPHERECTOMY. (N/A) APPLICATION OF CELL SAVER CLOSURE CYSTOTOMY Extensive lysis of adhesions.   SURGEON:  Surgeon(s) and Role:    * Christophe Louis, MD - Primary    * Alexis Frock, MD - Assisting    * Drema Dallas, DO - [place], Nicki Reaper, MD  PHYSICIAN ASSISTANT:   ASSISTANTS: Dr.Melissa Delora Fuel assisted due to complexity of the anatomy and concern for pelvic adhesive disease.    ANESTHESIA:   general  EBL:  650 mL   BLOOD ADMINISTERED: 250  CC CELLSAVER  DRAINS: (2) Jackson-Pratt drain(s) with closed bulb suction in the left lower quadrant  and Urinary Catheter (Foley)   LOCAL MEDICATIONS USED:  NONE  SPECIMEN:  Source of Specimen:  uterus cervix left fallopian tube and ovary  large uterine fibroids.   DISPOSITION OF SPECIMEN:  PATHOLOGY  COUNTS:  YES  TOURNIQUET:  * No tourniquets in log *  DICTATION: .Note written in EPIC  PLAN OF CARE: Admit to inpatient   PATIENT DISPOSITION:  PACU - hemodynamically stable.   Delay start of Pharmacological VTE agent (>24hrs) due to surgical blood loss or risk of bleeding: not applicable  Findings. Large anterior  and cervical uterine fibroids at least 20 cm by 14 cm in size  with adhesions of  the  bladder to the cervical uterine fibroid..    Procedure Details  The patient was seen in the Holding Room. The risks, benefits, complications, treatment options, and expected outcomes were discussed with the patient.  The patient concurred with the proposed plan, giving informed consent.  The site of surgery properly noted/marked. The patient was taken to Operating Room # 7, identified as Mackenzie Castillo and the procedure verified as Total abdominal hysterectomy, left  salpingo-oophorectomy. A Time Out was held and the above information confirmed.  After induction of anesthesia, the patient was draped and prepped in the usual sterile manner. Pt was placed in supine position after anesthesia and draped and prepped in the usual sterile manner. Foley catheter was placed.  A pfannenstiel  incision was made and carried through the subcutaneous tissue to the fascia. Fascial incision was made and extended laterally . The rectus muscles were separated. The peritoneum was identified and entered. Peritoneal incision was extended longitudinally.  The above findings were noted. Kirt Boys  retractor was placed and bowel was packed away from the surgical site.   The round ligaments were identified, cut, and ligated with 0-Vicryl. The anterior peritoneal reflection was incised and the bladder was dissected off the  large anterior fibroid. The Anterior Fibroid was inhibiting visualization. The superior aspect of the Fibroid was injected with Vasopressin. The fibroid was then dissected for the uterus and cored in an attempt to avoid injury to the bladder. Once the superior portion of the large fibroid was excised. It was noted that there was a cystotomy indicated injury to the bladder. A urology consult was requested to assist with excision of the bladder from the anterior fibroid and repair of the cystotomy. Please See Dr. Sallee Provencal Operative note for this portion of the procedure. .. Once the bladder was further dissected from the anterior fibroid the hysterectomy was completed   The right infundibulo-pelvic ligament was grasped, cut, and suture ligated with 0-Vicryl. The left infundibulo-pelvic ligament was grasped,  cut and suture ligated with 0-Vicryl. Hemostasis  was observed. The uterine vessels were skeletonized, then clamped, cut and suture ligated with 0-Vicryl suture. Serial pedicles of the cardinal and utero-sacral ligaments were clamped, cut, and suture ligated with  0-Vicryl. Entrance was made into the vagina and the uterus removed. . The vaginal cuff was then closed with interrupted  figure of 8  sutures of 0- Vicryl. Lavage was carried out until clear. Hemostasis was observed. Arista was placed along the vaginal cuff closure   Retractor and all packing was removed from the abdomen. The fascia was approximated with running sutures of 0- pDS  Lavage was again carried out. Hemostasis was observed. The skin was approximated with 4-0 vicryl   Instrument, sponge, and needle counts were correct prior to abdominal closure and at the conclusion of the case.    Estimated Blood Loss:   850 cc          Drains: J.P. drain in the left lower quadrant and foley catheter          Total IV Fluids:  Per anesthesia ml               Complications:  cystotomy          Disposition: PACU - hemodynamically stable.         Condition: stable  Attending Attestation: I performed the procedure.

## 2021-06-15 NOTE — H&P (Signed)
Date of Initial H&P: 06/13/2021  History reviewed, patient examined, no change in status, stable for surgery.

## 2021-06-15 NOTE — Anesthesia Procedure Notes (Signed)
Arterial Line Insertion Start/End6/22/2022 1:20 PM, 06/15/2021 1:28 PM Performed by: Pervis Hocking, DO, anesthesiologist  Patient location: OR. Preanesthetic checklist: patient identified, IV checked, site marked, risks and benefits discussed, surgical consent, monitors and equipment checked, pre-op evaluation, timeout performed and anesthesia consent Lidocaine 1% used for infiltration Left, radial was placed Catheter size: 20 Fr Hand hygiene performed  and maximum sterile barriers used   Attempts: 1 Procedure performed without using ultrasound guided technique. Following insertion, dressing applied. Post procedure assessment: normal and unchanged  Patient tolerated the procedure well with no immediate complications.

## 2021-06-16 ENCOUNTER — Encounter (HOSPITAL_COMMUNITY): Payer: Self-pay | Admitting: Obstetrics and Gynecology

## 2021-06-16 LAB — CBC
HCT: 26.3 % — ABNORMAL LOW (ref 36.0–46.0)
Hemoglobin: 7.6 g/dL — ABNORMAL LOW (ref 12.0–15.0)
MCH: 18.7 pg — ABNORMAL LOW (ref 26.0–34.0)
MCHC: 28.9 g/dL — ABNORMAL LOW (ref 30.0–36.0)
MCV: 64.8 fL — ABNORMAL LOW (ref 80.0–100.0)
Platelets: 215 10*3/uL (ref 150–400)
RBC: 4.06 MIL/uL (ref 3.87–5.11)
RDW: 19.4 % — ABNORMAL HIGH (ref 11.5–15.5)
WBC: 11.8 10*3/uL — ABNORMAL HIGH (ref 4.0–10.5)
nRBC: 0 % (ref 0.0–0.2)

## 2021-06-16 LAB — POCT I-STAT, CHEM 8
BUN: 11 mg/dL (ref 6–20)
Calcium, Ion: 1.19 mmol/L (ref 1.15–1.40)
Chloride: 102 mmol/L (ref 98–111)
Creatinine, Ser: 0.7 mg/dL (ref 0.44–1.00)
Glucose, Bld: 213 mg/dL — ABNORMAL HIGH (ref 70–99)
HCT: 26 % — ABNORMAL LOW (ref 36.0–46.0)
Hemoglobin: 8.8 g/dL — ABNORMAL LOW (ref 12.0–15.0)
Potassium: 3.8 mmol/L (ref 3.5–5.1)
Sodium: 137 mmol/L (ref 135–145)
TCO2: 26 mmol/L (ref 22–32)

## 2021-06-16 LAB — POCT I-STAT 7, (LYTES, BLD GAS, ICA,H+H)
Acid-Base Excess: 1 mmol/L (ref 0.0–2.0)
Bicarbonate: 26.4 mmol/L (ref 20.0–28.0)
Calcium, Ion: 1.1 mmol/L — ABNORMAL LOW (ref 1.15–1.40)
HCT: 26 % — ABNORMAL LOW (ref 36.0–46.0)
Hemoglobin: 8.8 g/dL — ABNORMAL LOW (ref 12.0–15.0)
O2 Saturation: 100 %
Potassium: 4 mmol/L (ref 3.5–5.1)
Sodium: 139 mmol/L (ref 135–145)
TCO2: 28 mmol/L (ref 22–32)
pCO2 arterial: 46.2 mmHg (ref 32.0–48.0)
pH, Arterial: 7.365 (ref 7.350–7.450)
pO2, Arterial: 198 mmHg — ABNORMAL HIGH (ref 83.0–108.0)

## 2021-06-16 LAB — GLUCOSE, CAPILLARY: Glucose-Capillary: 181 mg/dL — ABNORMAL HIGH (ref 70–99)

## 2021-06-16 MED ORDER — CHLORHEXIDINE GLUCONATE CLOTH 2 % EX PADS
6.0000 | MEDICATED_PAD | Freq: Every day | CUTANEOUS | Status: DC
  Administered 2021-06-16 – 2021-06-17 (×2): 6 via TOPICAL

## 2021-06-16 MED ORDER — SODIUM CHLORIDE 0.9 % IV SOLN
3.0000 g | Freq: Four times a day (QID) | INTRAVENOUS | Status: AC
  Administered 2021-06-16 (×3): 3 g via INTRAVENOUS
  Filled 2021-06-16 (×3): qty 3

## 2021-06-16 MED ORDER — BENZOCAINE-MENTHOL 20-0.5 % EX AERO
1.0000 "application " | INHALATION_SPRAY | Freq: Four times a day (QID) | CUTANEOUS | Status: DC | PRN
  Administered 2021-06-16: 1 via TOPICAL
  Filled 2021-06-16: qty 56

## 2021-06-16 MED ORDER — FERROUS SULFATE 325 (65 FE) MG PO TABS
325.0000 mg | ORAL_TABLET | Freq: Every day | ORAL | Status: DC
  Administered 2021-06-16 – 2021-06-17 (×2): 325 mg via ORAL
  Filled 2021-06-16 (×2): qty 1

## 2021-06-16 MED ORDER — SODIUM CHLORIDE 0.9 % IV SOLN: 3.0000 g | Freq: Four times a day (QID) | INTRAVENOUS | Status: DC

## 2021-06-16 NOTE — Final Progress Note (Signed)
Looks good Pink urine with good output Benign abdomen Small blood amount in JP drain See again tomorrow Will arrange outpt cystogram and f/up Assess drain in am

## 2021-06-16 NOTE — Progress Notes (Signed)
GYN Progress Note  Subjective: Patient feeling well this morning. Pain well-controlled. Foley in place. Has not ambulated. Has not had solid foods yet. Reports soreness at catheter site in the urethra. Reports understanding regarding her bladder injury at the time of surgery. Would like to see the pictures of her fibroid uterus again. Denies fevers, chills, chest pain, SOB, or N/V.  BP (!) 101/57 (BP Location: Right Arm)   Pulse 82   Temp 98.4 F (36.9 C) (Oral)   Resp 17   Ht 5\' 4"  (1.626 m)   Wt 106.3 kg   LMP 05/26/2019 (Approximate)   SpO2 100%   BMI 40.23 kg/m  Gen: NAD, pleasant and cooperative Cardio:  RRR Lungs:  CTAB, no wheezes/rales/rhonchi Abd:  Soft, non-distended, appropriately tender to palpation throughout, no rebound/guarding, honeycomb dressing in place and is c/d/i Ext:  No bilateral LE edema, SCDs on and working  Foley: ~200cc amber urine in bag  CBC Latest Ref Rng & Units 06/16/2021 06/08/2021 09/30/2016  WBC 4.0 - 10.5 K/uL 11.8(H) 8.6 11.3(H)  Hemoglobin 12.0 - 15.0 g/dL 7.6(L) 11.4(L) 13.0  Hematocrit 36.0 - 46.0 % 26.3(L) 39.7 39.5  Platelets 150 - 400 K/uL 215 311 300     I/O last 3 completed shifts: In: 6252.4 [P.O.:300; I.V.:5022.4; Blood:280; IV Piggyback:650] Out: 5093 [Urine:440; Drains:25; Blood:650] No intake/output data recorded.   A/P: POD#1 s/p TAH/LSO with cystotomy repair.  - Doing well post-operatively - JP drain in place - 25cc serosanguinous fluid, management as per Urology - GI: DAT  - Renal:  Foley in place - NOT to be removed due to cystotomy - IVF:  LR at 125cc/hour - Pain:  Well-controlled, Tylenol/Roxi/Motrin ordered - DVT Prophylaxis: SCDs - Antibiotics: Unasyn x 24hours - Activity:  Ambulate today - Labs: As above, iron ordered for acute blood loss anemia  Dispo: Likely D/C home POD#2-3  Drema Dallas, DO

## 2021-06-17 LAB — TYPE AND SCREEN
ABO/RH(D): O POS
Antibody Screen: NEGATIVE
Unit division: 0
Unit division: 0

## 2021-06-17 LAB — BPAM RBC
Blood Product Expiration Date: 202207272359
Blood Product Expiration Date: 202207272359
ISSUE DATE / TIME: 202206221344
ISSUE DATE / TIME: 202206221344
Unit Type and Rh: 5100
Unit Type and Rh: 5100

## 2021-06-17 LAB — SURGICAL PATHOLOGY

## 2021-06-17 MED ORDER — OXYCODONE HCL 5 MG PO TABS: 5.0000 mg | ORAL_TABLET | ORAL | 0 refills | Status: AC | PRN

## 2021-06-17 MED ORDER — IBUPROFEN 600 MG PO TABS: 600.0000 mg | ORAL_TABLET | Freq: Four times a day (QID) | ORAL | 1 refills | Status: AC | PRN

## 2021-06-17 MED ORDER — ACETAMINOPHEN 500 MG PO TABS: 1000.0000 mg | ORAL_TABLET | Freq: Three times a day (TID) | ORAL | 0 refills | Status: AC | PRN

## 2021-06-17 MED ORDER — FERROUS SULFATE 325 (65 FE) MG PO TABS: 325.0000 mg | ORAL_TABLET | Freq: Every day | ORAL | 1 refills | Status: AC

## 2021-06-17 MED FILL — Sodium Chloride IV Soln 0.9%: INTRAVENOUS | Qty: 1000 | Status: AC

## 2021-06-17 MED FILL — Heparin Sodium (Porcine) Inj 1000 Unit/ML: INTRAMUSCULAR | Qty: 30 | Status: AC

## 2021-06-17 NOTE — Final Progress Note (Signed)
Drain 50 - can d/c when OK with gynecology team Urine output great Send home with foley  I will call patient Monday and see in office next week to arrange cystogram

## 2021-06-17 NOTE — Progress Notes (Signed)
Discharge teaching given to patient. Patient verbalized understanding of the teaching. Both IV's removed. Patient ready to be discharged just waiting for husband to arrive with her clothes.

## 2021-06-21 ENCOUNTER — Other Ambulatory Visit (HOSPITAL_COMMUNITY): Payer: Self-pay | Admitting: Urology

## 2021-06-21 ENCOUNTER — Other Ambulatory Visit: Payer: Self-pay | Admitting: Urgent Care

## 2021-06-21 ENCOUNTER — Other Ambulatory Visit: Payer: Self-pay | Admitting: Urology

## 2021-06-21 DIAGNOSIS — S3720XA Unspecified injury of bladder, initial encounter: Secondary | ICD-10-CM

## 2021-06-23 NOTE — Discharge Summary (Signed)
Physician Discharge Summary  Patient ID: Mackenzie Castillo MRN: [id number] DOB/AGE: 1971-02-08 50 y.o.  Admit date: 06/15/2021 Discharge date: 06/23/2021  Admission [place] fibroids  Discharge Diagnoses:  Active Problems:   Fibroids   S/P TAH (total abdominal hysterectomy) Cystotomy   Discharged Condition: stable  Hospital Course: pt was admitted after undergoing a total abdominal hysterectomy with left salpoingoophorectomy and repair of cystotomy that occurred during surgery. She did well postoperatively with return of bowel function. She is discharged home with foley catheter and to follow up with Dr. Mechele Collin with [facility]. She received teaching on how to use and empty leg bag prior to discharge.    Consults: urology  Significant Diagnostic Studies: labs: hgb on POD #1 was 7.6  Treatments: surgery: total abdominal hysterectomy with left salpingoophorectomy   Discharge Exam: Blood pressure 101/63, pulse 84, temperature 99.5 F (37.5 C), temperature source Oral, resp. rate 18, height 5\' 4"  (1.626 m), weight 106.3 kg, last menstrual period 05/26/2019, SpO2 98 %. General appearance: alert, cooperative, and no distress Resp: No distress  GI: soft appropriately tender nondistended  Extremities: extremities normal, atraumatic, no cyanosis or edema Incision/Wound: well approximated no erythema or exudate   Disposition: Discharge disposition: 01-Home or Self Care       Discharge Instructions      Remove dressing in 72 hours   Complete by: As directed    Call MD for:  persistant nausea and vomiting   Complete by: As directed    Call MD for:  redness, tenderness, or signs of infection (pain, swelling, redness, odor or green/yellow discharge around incision site)   Complete by: As directed    Call MD for:  severe uncontrolled pain   Complete by: As directed    Call MD for:  temperature >100.4   Complete by: As directed    Diet - low sodium heart healthy    Complete by: As directed    Driving Restrictions   Complete by: As directed    Avoid driving for 2 weeks   Increase activity slowly   Complete by: As directed    Lifting restrictions   Complete by: As directed    Avoid lifting over 10 lbs   Sexual Activity Restrictions   Complete by: As directed    Avoid sex for 6-8 weeks and until approved by Dr. Landry Mellow      Allergies as of 06/17/2021   No Known Allergies      Medication List     TAKE these medications    acetaminophen 500 MG tablet Commonly known as: TYLENOL Take 2 tablets (1,000 mg total) by mouth every 8 (eight) hours as needed.   cetirizine 10 MG tablet Commonly known as: ZYRTEC Take 10 mg by mouth daily.   citalopram 40 MG tablet Commonly known as: CELEXA Take 40 mg by mouth at bedtime.   ferrous sulfate 325 (65 FE) MG tablet Take 1 tablet (325 mg total) by mouth daily with breakfast.   ibuprofen 600 MG tablet Commonly known as: ADVIL Take 1 tablet (600 mg total) by mouth every 6 (six) hours as needed for mild pain, moderate pain or cramping.   Melatonin 10 MG Tabs Take 10 mg by mouth at bedtime.   METHYLFOLATE PO Take 1 capsule by mouth daily.   oxyCODONE 5 MG immediate release tablet Commonly known as: Oxy IR/ROXICODONE Take 1-2 tablets (5-10 mg total) by mouth every 4 (four) hours as needed for up to 7 days for moderate pain or severe  pain.   ProAir HFA 108 (90 Base) MCG/ACT inhaler Generic drug: albuterol Inhale 1 puff into the lungs every 4 (four) hours as needed for wheezing or shortness of breath.   Symbicort 160-4.5 MCG/ACT inhaler Generic drug: budesonide-formoterol Inhale 2 puffs into the lungs 2 (two) times daily.        Follow-up Information     Christophe Louis, MD. Go in 2 week(s).   Specialty: Obstetrics and Gynecology Why: please keep your postoperative appointment with Dr. Landry Mellow in 2 weeks Contact information: [address] [place] [address] [phone]          Bjorn Loser, MD. Call in 3 day(s).   Specialty: Urology Why: please call Dr. Mechele Collin with [facility] to schedule a hospital follow up  .Marland Kitchen Please call his office on 06/20/2021 Contact information: [place] [address] [phone]                 Signed: Christophe Louis 06/23/2021, 2:26 PM

## 2021-06-29 ENCOUNTER — Other Ambulatory Visit: Payer: Self-pay

## 2021-06-29 ENCOUNTER — Ambulatory Visit (HOSPITAL_COMMUNITY)
Admission: RE | Admit: 2021-06-29 | Discharge: 2021-06-29 | Disposition: A | Discharge status: Home / Self Care | Payer: Medicaid Other | Source: Ambulatory Visit | Attending: Urology | Admitting: Urology

## 2021-06-29 DIAGNOSIS — Z90710 Acquired absence of both cervix and uterus: Secondary | ICD-10-CM | POA: Insufficient documentation

## 2021-06-29 DIAGNOSIS — Y838 Other surgical procedures as the cause of abnormal reaction of the patient, or of later complication, without mention of misadventure at the time of the procedure: Secondary | ICD-10-CM | POA: Diagnosis not present

## 2021-06-29 DIAGNOSIS — X58XXXA Exposure to other specified factors, initial encounter: Secondary | ICD-10-CM | POA: Diagnosis not present

## 2021-06-29 DIAGNOSIS — S3720XA Unspecified injury of bladder, initial encounter: Secondary | ICD-10-CM | POA: Insufficient documentation

## 2021-06-29 MED ORDER — IOTHALAMATE MEGLUMINE 17.2 % UR SOLN
250.0000 mL | Freq: Once | URETHRAL | Status: AC | PRN
  Administered 2021-06-29: 250 mL via INTRAVESICAL

## 2021-07-20 ENCOUNTER — Other Ambulatory Visit: Payer: Self-pay | Admitting: Family Medicine

## 2021-07-20 DIAGNOSIS — Z1231 Encounter for screening mammogram for malignant neoplasm of breast: Secondary | ICD-10-CM

## 2021-07-25 ENCOUNTER — Other Ambulatory Visit (HOSPITAL_COMMUNITY): Payer: Self-pay | Admitting: *Deleted

## 2021-07-26 ENCOUNTER — Encounter (HOSPITAL_COMMUNITY)
Admission: RE | Admit: 2021-07-26 | Discharge: 2021-07-26 | Disposition: A | Discharge status: Home / Self Care | Payer: Medicaid Other | Source: Ambulatory Visit | Attending: Family Medicine | Admitting: Family Medicine

## 2021-07-26 DIAGNOSIS — D509 Iron deficiency anemia, unspecified: Secondary | ICD-10-CM | POA: Diagnosis not present

## 2021-07-26 MED ORDER — SODIUM CHLORIDE 0.9 % IV SOLN
510.0000 mg | INTRAVENOUS | Status: DC
  Administered 2021-07-26: 510 mg via INTRAVENOUS
  Filled 2021-07-26: qty 510

## 2021-08-02 ENCOUNTER — Encounter (HOSPITAL_COMMUNITY): Payer: Medicaid Other

## 2021-08-08 ENCOUNTER — Ambulatory Visit: Payer: Medicaid Other

## 2021-08-09 ENCOUNTER — Encounter (HOSPITAL_COMMUNITY): Payer: Medicaid Other

## 2021-08-10 ENCOUNTER — Encounter (HOSPITAL_COMMUNITY)
Admission: RE | Admit: 2021-08-10 | Discharge: 2021-08-10 | Disposition: A | Discharge status: Home / Self Care | Payer: Medicaid Other | Source: Ambulatory Visit | Attending: Family Medicine | Admitting: Family Medicine

## 2021-08-10 DIAGNOSIS — D509 Iron deficiency anemia, unspecified: Secondary | ICD-10-CM | POA: Diagnosis not present

## 2021-08-10 MED ORDER — SODIUM CHLORIDE 0.9 % IV SOLN
510.0000 mg | INTRAVENOUS | Status: AC
  Administered 2021-08-10: 510 mg via INTRAVENOUS
  Filled 2021-08-10: qty 510

## 2021-09-07 ENCOUNTER — Ambulatory Visit
Admission: RE | Admit: 2021-09-07 | Discharge: 2021-09-07 | Disposition: A | Discharge status: Home / Self Care | Payer: Medicaid Other | Source: Ambulatory Visit | Attending: Family Medicine | Admitting: Family Medicine

## 2021-09-07 ENCOUNTER — Other Ambulatory Visit: Payer: Self-pay

## 2021-09-07 DIAGNOSIS — Z1231 Encounter for screening mammogram for malignant neoplasm of breast: Secondary | ICD-10-CM

## 2021-10-13 ENCOUNTER — Ambulatory Visit: Payer: Medicaid Other | Admitting: Neurology

## 2022-08-15 ENCOUNTER — Other Ambulatory Visit: Payer: Self-pay | Admitting: Family Medicine

## 2022-08-15 DIAGNOSIS — Z1231 Encounter for screening mammogram for malignant neoplasm of breast: Secondary | ICD-10-CM

## 2022-09-08 ENCOUNTER — Ambulatory Visit: Payer: Medicaid Other

## 2022-09-29 ENCOUNTER — Ambulatory Visit
Admission: RE | Admit: 2022-09-29 | Discharge: 2022-09-29 | Disposition: A | Discharge status: Home / Self Care | Payer: Medicaid Other | Source: Ambulatory Visit | Attending: Family Medicine | Admitting: Family Medicine

## 2022-09-29 DIAGNOSIS — Z1231 Encounter for screening mammogram for malignant neoplasm of breast: Secondary | ICD-10-CM

## 2022-10-03 ENCOUNTER — Other Ambulatory Visit: Payer: Self-pay | Admitting: Family Medicine

## 2022-10-03 DIAGNOSIS — R928 Other abnormal and inconclusive findings on diagnostic imaging of breast: Secondary | ICD-10-CM

## 2022-10-19 ENCOUNTER — Ambulatory Visit
Admission: RE | Admit: 2022-10-19 | Discharge: 2022-10-19 | Disposition: A | Discharge status: Home / Self Care | Payer: Medicaid Other | Source: Ambulatory Visit | Attending: Family Medicine | Admitting: Family Medicine

## 2022-10-19 ENCOUNTER — Other Ambulatory Visit: Payer: Self-pay | Admitting: Family Medicine

## 2022-10-19 ENCOUNTER — Ambulatory Visit: Payer: Medicaid Other

## 2022-10-19 DIAGNOSIS — R928 Other abnormal and inconclusive findings on diagnostic imaging of breast: Secondary | ICD-10-CM

## 2023-05-23 IMAGING — MG MM DIGITAL SCREENING BILAT W/ TOMO AND CAD
8 series · 8 of 24 positions shown · non-contrast
Comparison: Previous exam(s).

CLINICAL DATA: Screening.

EXAM:
DIGITAL SCREENING BILATERAL MAMMOGRAM WITH TOMOSYNTHESIS AND CAD
TECHNIQUE: Bilateral screening digital craniocaudal and mediolateral oblique
mammograms were obtained. Bilateral screening digital breast
tomosynthesis was performed. The images were evaluated with
computer-aided detection.

[R MLO synth-2D]
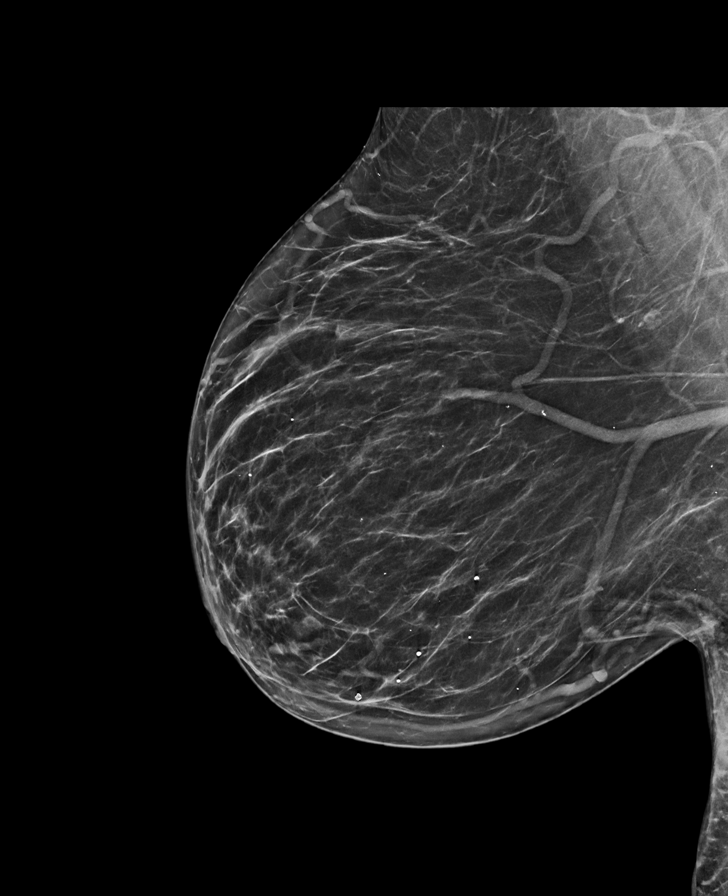

[L CC synth-2D]
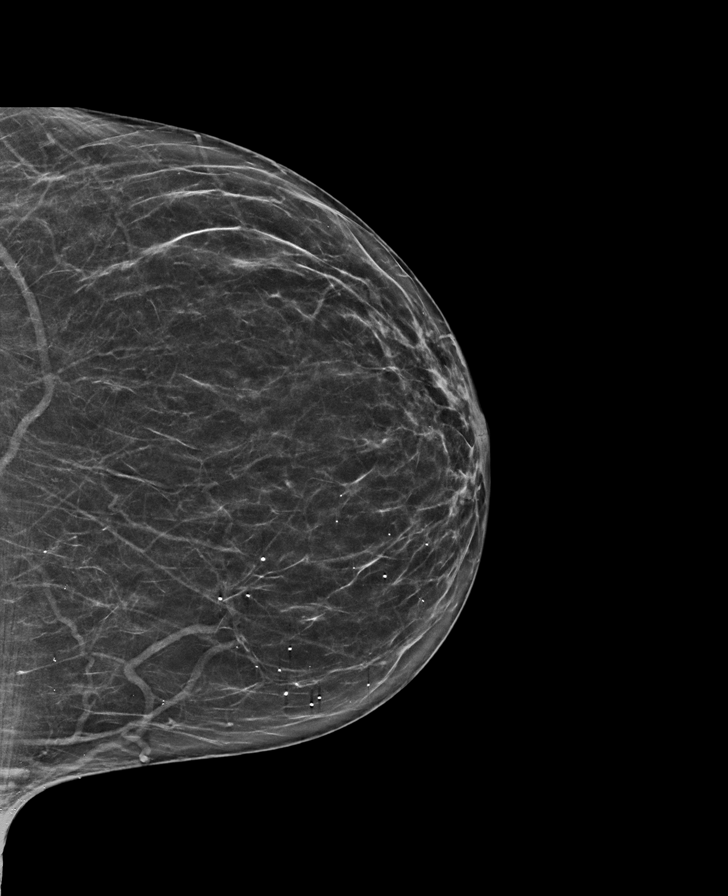

[R CC synth-2D]
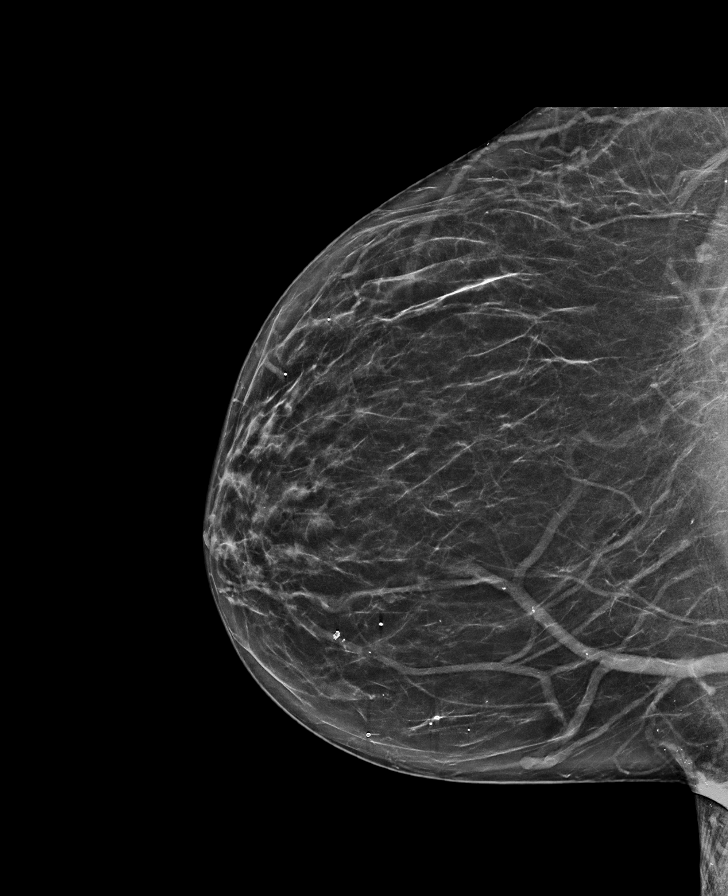

[L MLO synth-2D]
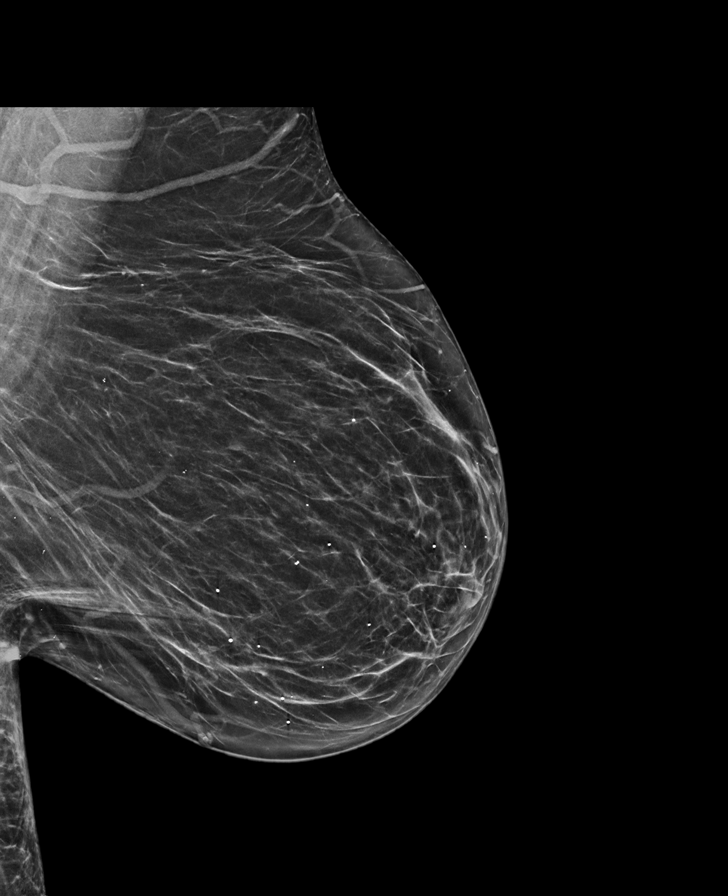

[L MLO tomo · tomo slice 35/68.0]
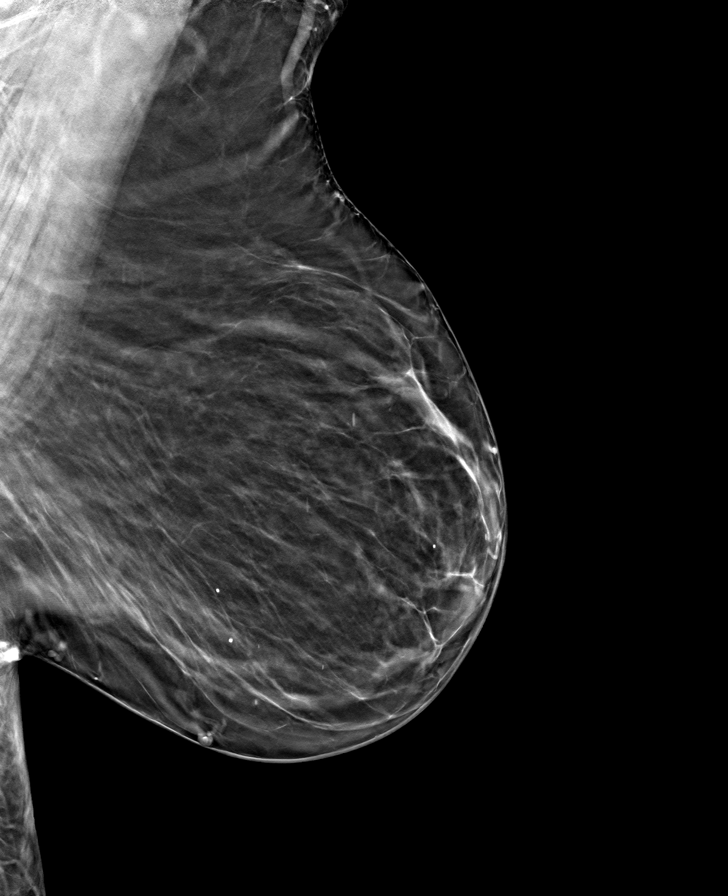

[R CC tomo · tomo slice 33/65.0]
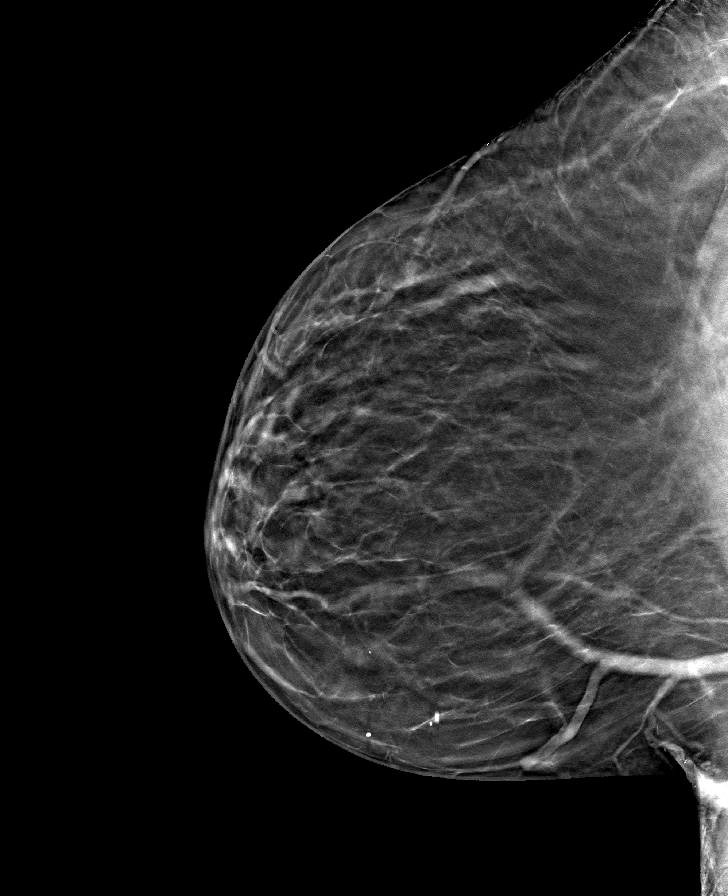

[L CC tomo · tomo slice 31/60.0]
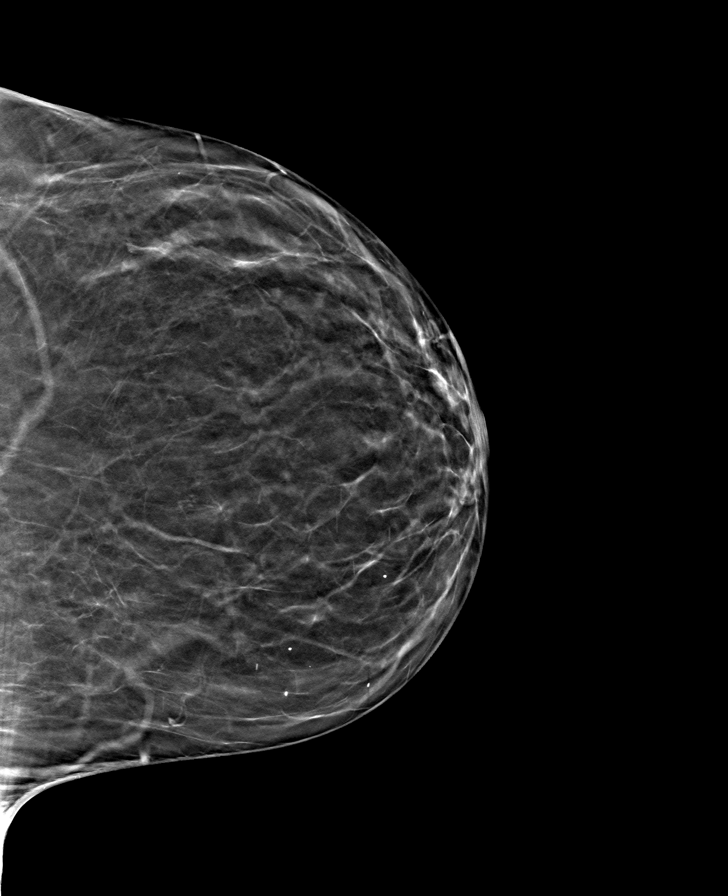

[R MLO tomo · tomo slice 35/70.0]
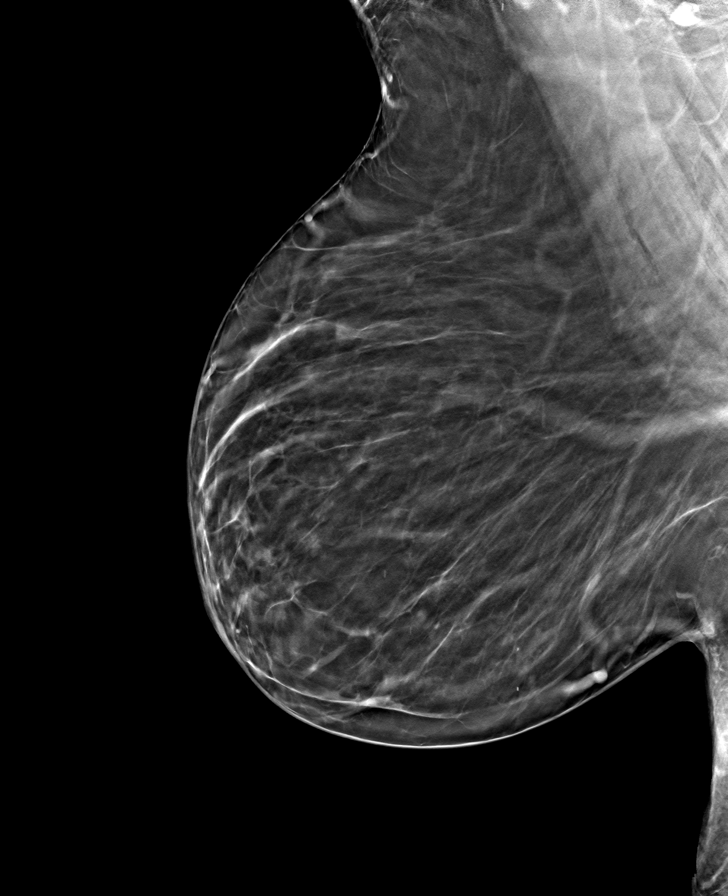

[8 of 24 positions shown; findings below may reference images not displayed]

ACR Breast Density Category b: There are scattered areas of
fibroglandular density.
FINDINGS: There are no findings suspicious for malignancy.
IMPRESSION: No mammographic evidence of malignancy. A result letter of this
screening mammogram will be mailed directly to the patient.

RECOMMENDATION:
Screening mammogram in one year. (Code:51-O-LD2)

BI-RADS CATEGORY  1: Negative.

## 2023-09-11 ENCOUNTER — Other Ambulatory Visit: Payer: Self-pay | Admitting: Family Medicine

## 2023-09-11 DIAGNOSIS — Z1231 Encounter for screening mammogram for malignant neoplasm of breast: Secondary | ICD-10-CM

## 2023-10-05 ENCOUNTER — Ambulatory Visit
Admission: RE | Admit: 2023-10-05 | Discharge: 2023-10-05 | Disposition: A | Discharge status: Home / Self Care | Payer: Medicaid Other | Source: Ambulatory Visit | Attending: Family Medicine | Admitting: Family Medicine

## 2023-10-05 DIAGNOSIS — Z1231 Encounter for screening mammogram for malignant neoplasm of breast: Secondary | ICD-10-CM

## 2024-09-04 ENCOUNTER — Other Ambulatory Visit: Payer: Self-pay | Admitting: Family Medicine

## 2024-09-04 DIAGNOSIS — Z1231 Encounter for screening mammogram for malignant neoplasm of breast: Secondary | ICD-10-CM

## 2024-10-09 ENCOUNTER — Ambulatory Visit
Admission: RE | Admit: 2024-10-09 | Discharge: 2024-10-09 | Disposition: A | Discharge status: Home / Self Care | Source: Ambulatory Visit | Attending: Family Medicine | Admitting: Family Medicine

## 2024-10-09 DIAGNOSIS — Z1231 Encounter for screening mammogram for malignant neoplasm of breast: Secondary | ICD-10-CM

## 2024-12-12 ENCOUNTER — Telehealth: Payer: Self-pay | Admitting: Acute Care

## 2024-12-12 DIAGNOSIS — Z87891 Personal history of nicotine dependence: Secondary | ICD-10-CM

## 2024-12-12 DIAGNOSIS — Z122 Encounter for screening for malignant neoplasm of respiratory organs: Secondary | ICD-10-CM

## 2024-12-12 DIAGNOSIS — F17210 Nicotine dependence, cigarettes, uncomplicated: Secondary | ICD-10-CM

## 2024-12-12 NOTE — Telephone Encounter (Signed)
 Lung Cancer Screening Narrative/Criteria Questionnaire (Cigarette Smokers Only- No Cigars/Pipes/vapes)   Addaline Peplinski Garabedian   SDMV:12/22/2024 [address]     09/08/1971   LDCT: 12/30/2024 1140  GI   53 y.o.   Phone: [phone]  Lung Screening Narrative (confirm age 77-77 yrs Medicare / 50-80 yrs Private pay insurance)   Insurance information:mcd   Referring Provider:Dr. Elsie Lesches   This screening involves an initial phone call with a team member from our program. It is called a shared decision making visit. The initial meeting is required by  insurance and Medicare to make sure you understand the program. This appointment takes about 15-20 minutes to complete. You will complete the screening scan at your scheduled date/time.  This scan takes about 5-10 minutes to complete. You can eat and drink normally before and after the scan.  Criteria questions for Lung Cancer Screening:   Are you a current or former smoker? Current Age began smoking: 53yo   If you are a former smoker, what year did you quit smoking? Quit for 10 years then started again (within 15 yrs)   To calculate your smoking history, I need an accurate estimate of how many packs of cigarettes you smoked per day and for how many years. (Not just the number of PPD you are now smoking)   Years smoking 25 x Packs per day 1 = Pack years 25   (at least 20 pack yrs)   (Make sure they understand that we need to know how much they have smoked in the past, not just the number of PPD they are smoking now)  Do you have a personal history of cancer?  No    Do you have a family history of cancer? No  Are you coughing up blood?  No  Have you had unexplained weight loss of 15 lbs or more in the last 6 months? No  It looks like you meet all criteria.  When would be a good time for us  to schedule you for this screening?   Additional information: N/A

## 2024-12-22 ENCOUNTER — Encounter: Payer: Self-pay | Admitting: *Deleted

## 2024-12-22 ENCOUNTER — Ambulatory Visit: Admitting: *Deleted

## 2024-12-22 DIAGNOSIS — F17210 Nicotine dependence, cigarettes, uncomplicated: Secondary | ICD-10-CM

## 2024-12-22 NOTE — Progress Notes (Signed)
 Virtual Visit via Telephone Note  I connected with Mackenzie Castillo on 12/22/2024 at 10:00 AM EST by telephone and verified that I am speaking with the correct person using two identifiers.  Location: Patient: at home Provider: [address], [place], [place], [address]    I discussed the limitations, risks, security and privacy concerns of performing an evaluation and management service by telephone and the availability of in person appointments. I also discussed with the patient that there may be a patient responsible charge related to this service. The patient expressed understanding and agreed to proceed.    Shared Decision Making Visit Lung Cancer Screening Program [phone])   Eligibility: Age 53 y.o. Pack Years Smoking History Calculation 25 (# packs/per year x # years smoked) Recent History of coughing up blood  no Unexplained weight loss? no ( >Than 15 pounds within the last 6 months ) Prior History Lung / other cancer no (Diagnosis within the last 5 years already requiring surveillance chest CT Scans). Smoking Status Current Smoker Former Smokers: Years since quit: n/a  Quit Date: n/a  Visit Components: Discussion included one or more decision making aids. yes Discussion included risk/benefits of screening. yes Discussion included potential follow up diagnostic testing for abnormal scans. yes Discussion included meaning and risk of over diagnosis. yes Discussion included meaning and risk of False Positives. yes Discussion included meaning of total radiation exposure. yes  Counseling Included: Importance of adherence to annual lung cancer LDCT screening. yes Impact of comorbidities on ability to participate in the program. yes Ability and willingness to under diagnostic treatment. yes  Smoking Cessation Counseling: Current Smokers:  Discussed importance of smoking cessation. yes Information about tobacco cessation classes and interventions provided to  patient. yes Patient provided with ticket for LDCT Scan. no Symptomatic Patient. no  Counseling(Intermediate counseling: > three minutes) [address] Diagnosis Code: Tobacco Use Z72.0 Asymptomatic Patient yes  Smoking/Tobacco Cessation Counseling Mackenzie Castillo is a current user of tobacco or nicotine products. She is ready to quit at this time. Counseling provided today addressed the risks of continued use and the benefits of cessation. Discussed tobacco/nicotine use history, readiness to quit, and evidence-based treatment options including behavioral strategies, support resources, and pharmacologic therapies. Provided encouragement and educational materials on steps and resources to quit smoking. Patient questions were addressed, and follow-up recommended for continued support. Total time spent on counseling: 4 minutes.   Former Smokers:  Discussed the importance of maintaining cigarette abstinence. yes Diagnosis Code: Personal History of Nicotine Dependence. S12.108 Information about tobacco cessation classes and interventions provided to patient. Yes Patient provided with ticket for LDCT Scan. no Written Order for Lung Cancer Screening with LDCT placed in Epic. Yes (CT Chest Lung Cancer Screening Low Dose W/O CM) [id number] Z12.2-Screening of respiratory organs Z87.891-Personal history of nicotine dependence   Laneta Speaks, RN

## 2024-12-22 NOTE — Patient Instructions (Signed)

## 2024-12-30 ENCOUNTER — Other Ambulatory Visit

## 2024-12-30 DIAGNOSIS — Z122 Encounter for screening for malignant neoplasm of respiratory organs: Secondary | ICD-10-CM

## 2024-12-30 DIAGNOSIS — Z87891 Personal history of nicotine dependence: Secondary | ICD-10-CM

## 2024-12-30 DIAGNOSIS — F17210 Nicotine dependence, cigarettes, uncomplicated: Secondary | ICD-10-CM

## 2025-01-05 ENCOUNTER — Other Ambulatory Visit: Payer: Self-pay | Admitting: Acute Care

## 2025-01-05 DIAGNOSIS — F17210 Nicotine dependence, cigarettes, uncomplicated: Secondary | ICD-10-CM

## 2025-01-05 DIAGNOSIS — Z87891 Personal history of nicotine dependence: Secondary | ICD-10-CM

## 2025-01-05 DIAGNOSIS — Z122 Encounter for screening for malignant neoplasm of respiratory organs: Secondary | ICD-10-CM

## 2025-04-01 ENCOUNTER — Ambulatory Visit: Admitting: Internal Medicine
